# Patient Record
Sex: Female | Born: 1940 | Race: White | Hispanic: No | Smoking: Never smoker
Health system: Southern US, Community
[De-identification: ages and names within clinical notes are randomized; demographics above are authoritative.]

## PROBLEM LIST (undated history)

## (undated) DIAGNOSIS — I10 Essential (primary) hypertension: Secondary | ICD-10-CM

## (undated) DIAGNOSIS — E079 Disorder of thyroid, unspecified: Secondary | ICD-10-CM

## (undated) HISTORY — PX: TONSILLECTOMY: SUR1361

---

## 2005-01-21 ENCOUNTER — Ambulatory Visit: Payer: Self-pay | Admitting: Family Medicine

## 2005-02-16 ENCOUNTER — Ambulatory Visit: Payer: Self-pay | Admitting: Internal Medicine

## 2006-04-07 ENCOUNTER — Ambulatory Visit: Payer: Self-pay | Admitting: Family Medicine

## 2007-05-01 ENCOUNTER — Ambulatory Visit: Payer: Self-pay | Admitting: Family Medicine

## 2008-05-02 ENCOUNTER — Ambulatory Visit: Payer: Self-pay | Admitting: Family Medicine

## 2009-05-24 ENCOUNTER — Ambulatory Visit: Payer: Self-pay | Admitting: Family Medicine

## 2009-06-01 ENCOUNTER — Ambulatory Visit: Payer: Self-pay | Admitting: Family Medicine

## 2010-05-28 ENCOUNTER — Ambulatory Visit: Payer: Self-pay | Admitting: Family Medicine

## 2011-08-23 ENCOUNTER — Ambulatory Visit: Payer: Self-pay | Admitting: Family Medicine

## 2012-08-26 ENCOUNTER — Ambulatory Visit: Payer: Self-pay | Admitting: Family Medicine

## 2013-08-27 ENCOUNTER — Ambulatory Visit: Payer: Self-pay | Admitting: Family Medicine

## 2014-08-30 ENCOUNTER — Ambulatory Visit: Payer: Self-pay | Admitting: Family Medicine

## 2014-09-28 DIAGNOSIS — I10 Essential (primary) hypertension: Secondary | ICD-10-CM | POA: Insufficient documentation

## 2015-09-20 ENCOUNTER — Other Ambulatory Visit: Payer: Self-pay | Admitting: Family Medicine

## 2015-09-20 DIAGNOSIS — Z1231 Encounter for screening mammogram for malignant neoplasm of breast: Secondary | ICD-10-CM

## 2015-10-05 ENCOUNTER — Ambulatory Visit
Admission: RE | Admit: 2015-10-05 | Discharge: 2015-10-05 | Disposition: A | Discharge status: Home / Self Care | Payer: Medicare Other | Source: Ambulatory Visit | Attending: Family Medicine | Admitting: Family Medicine

## 2015-10-05 ENCOUNTER — Other Ambulatory Visit: Payer: Self-pay | Admitting: Family Medicine

## 2015-10-05 DIAGNOSIS — Z1231 Encounter for screening mammogram for malignant neoplasm of breast: Secondary | ICD-10-CM | POA: Diagnosis not present

## 2016-08-27 ENCOUNTER — Other Ambulatory Visit: Payer: Self-pay | Admitting: Family Medicine

## 2016-08-27 DIAGNOSIS — Z1231 Encounter for screening mammogram for malignant neoplasm of breast: Secondary | ICD-10-CM

## 2016-10-07 ENCOUNTER — Ambulatory Visit
Admission: RE | Admit: 2016-10-07 | Discharge: 2016-10-07 | Disposition: A | Discharge status: Home / Self Care | Payer: Medicare Other | Source: Ambulatory Visit | Attending: Family Medicine | Admitting: Family Medicine

## 2016-10-07 ENCOUNTER — Encounter: Payer: Self-pay | Admitting: Radiology

## 2016-10-07 DIAGNOSIS — Z1231 Encounter for screening mammogram for malignant neoplasm of breast: Secondary | ICD-10-CM | POA: Diagnosis present

## 2017-09-01 ENCOUNTER — Other Ambulatory Visit: Payer: Self-pay | Admitting: Family Medicine

## 2017-09-01 DIAGNOSIS — Z1231 Encounter for screening mammogram for malignant neoplasm of breast: Secondary | ICD-10-CM

## 2017-09-04 ENCOUNTER — Other Ambulatory Visit: Payer: Self-pay | Admitting: Family Medicine

## 2017-09-04 DIAGNOSIS — Z780 Asymptomatic menopausal state: Secondary | ICD-10-CM

## 2017-10-09 ENCOUNTER — Ambulatory Visit
Admission: RE | Admit: 2017-10-09 | Discharge: 2017-10-09 | Disposition: A | Discharge status: Home / Self Care | Payer: Medicare Other | Source: Ambulatory Visit | Attending: Family Medicine | Admitting: Family Medicine

## 2017-10-09 DIAGNOSIS — Z1231 Encounter for screening mammogram for malignant neoplasm of breast: Secondary | ICD-10-CM | POA: Insufficient documentation

## 2017-12-17 ENCOUNTER — Ambulatory Visit
Admission: RE | Admit: 2017-12-17 | Discharge: 2017-12-17 | Disposition: A | Discharge status: Home / Self Care | Payer: Medicare Other | Source: Ambulatory Visit | Attending: Family Medicine | Admitting: Family Medicine

## 2017-12-17 DIAGNOSIS — Z780 Asymptomatic menopausal state: Secondary | ICD-10-CM | POA: Diagnosis present

## 2017-12-17 DIAGNOSIS — M810 Age-related osteoporosis without current pathological fracture: Secondary | ICD-10-CM | POA: Diagnosis not present

## 2017-12-17 DIAGNOSIS — I10 Essential (primary) hypertension: Secondary | ICD-10-CM | POA: Insufficient documentation

## 2018-09-01 ENCOUNTER — Other Ambulatory Visit: Payer: Self-pay | Admitting: Family Medicine

## 2018-09-01 DIAGNOSIS — Z1231 Encounter for screening mammogram for malignant neoplasm of breast: Secondary | ICD-10-CM

## 2018-10-12 ENCOUNTER — Ambulatory Visit
Admission: RE | Admit: 2018-10-12 | Discharge: 2018-10-12 | Disposition: A | Discharge status: Home / Self Care | Payer: Medicare Other | Source: Ambulatory Visit | Attending: Family Medicine | Admitting: Family Medicine

## 2018-10-12 DIAGNOSIS — Z1231 Encounter for screening mammogram for malignant neoplasm of breast: Secondary | ICD-10-CM | POA: Diagnosis not present

## 2019-09-13 ENCOUNTER — Other Ambulatory Visit: Payer: Self-pay | Admitting: Family Medicine

## 2019-09-13 DIAGNOSIS — Z1231 Encounter for screening mammogram for malignant neoplasm of breast: Secondary | ICD-10-CM

## 2019-10-21 ENCOUNTER — Ambulatory Visit
Admission: RE | Admit: 2019-10-21 | Discharge: 2019-10-21 | Disposition: A | Discharge status: Home / Self Care | Payer: Medicare Other | Source: Ambulatory Visit | Attending: Family Medicine | Admitting: Family Medicine

## 2019-10-21 DIAGNOSIS — Z1231 Encounter for screening mammogram for malignant neoplasm of breast: Secondary | ICD-10-CM | POA: Diagnosis not present

## 2020-09-18 ENCOUNTER — Other Ambulatory Visit: Payer: Self-pay | Admitting: Family Medicine

## 2020-09-18 DIAGNOSIS — Z1231 Encounter for screening mammogram for malignant neoplasm of breast: Secondary | ICD-10-CM

## 2020-10-24 ENCOUNTER — Ambulatory Visit
Admission: RE | Admit: 2020-10-24 | Discharge: 2020-10-24 | Disposition: A | Discharge status: Home / Self Care | Payer: Medicare Other | Attending: Family Medicine | Admitting: Family Medicine

## 2020-10-24 ENCOUNTER — Ambulatory Visit
Admission: RE | Admit: 2020-10-24 | Discharge: 2020-10-24 | Disposition: A | Discharge status: Home / Self Care | Payer: Medicare Other | Source: Ambulatory Visit | Attending: Family Medicine | Admitting: Family Medicine

## 2020-10-24 ENCOUNTER — Other Ambulatory Visit: Payer: Self-pay

## 2020-10-24 ENCOUNTER — Other Ambulatory Visit: Payer: Self-pay | Admitting: Family Medicine

## 2020-10-24 DIAGNOSIS — M79604 Pain in right leg: Secondary | ICD-10-CM

## 2020-10-24 DIAGNOSIS — Z1231 Encounter for screening mammogram for malignant neoplasm of breast: Secondary | ICD-10-CM | POA: Insufficient documentation

## 2021-09-19 ENCOUNTER — Other Ambulatory Visit: Payer: Self-pay | Admitting: Family Medicine

## 2021-09-19 DIAGNOSIS — Z1231 Encounter for screening mammogram for malignant neoplasm of breast: Secondary | ICD-10-CM

## 2021-11-30 ENCOUNTER — Ambulatory Visit
Admission: RE | Admit: 2021-11-30 | Discharge: 2021-11-30 | Disposition: A | Discharge status: Home / Self Care | Payer: Medicare Other | Source: Ambulatory Visit | Attending: Family Medicine | Admitting: Family Medicine

## 2021-11-30 DIAGNOSIS — Z1231 Encounter for screening mammogram for malignant neoplasm of breast: Secondary | ICD-10-CM | POA: Insufficient documentation

## 2022-08-06 ENCOUNTER — Other Ambulatory Visit: Payer: Self-pay

## 2022-08-06 DIAGNOSIS — M810 Age-related osteoporosis without current pathological fracture: Secondary | ICD-10-CM

## 2022-08-06 DIAGNOSIS — Z1231 Encounter for screening mammogram for malignant neoplasm of breast: Secondary | ICD-10-CM

## 2022-08-17 IMAGING — MG MM DIGITAL SCREENING BILAT W/ TOMO AND CAD
6 of 12 series · 6 of 36 positions shown · non-contrast
Comparison: Previous exam(s).

CLINICAL DATA: Screening.

EXAM:
DIGITAL SCREENING BILATERAL MAMMOGRAM WITH TOMOSYNTHESIS AND CAD
TECHNIQUE: Bilateral screening digital craniocaudal and mediolateral oblique
mammograms were obtained. Bilateral screening digital breast
tomosynthesis was performed. The images were evaluated with
computer-aided detection.

[L MLO synth-2D (1 of 2)]
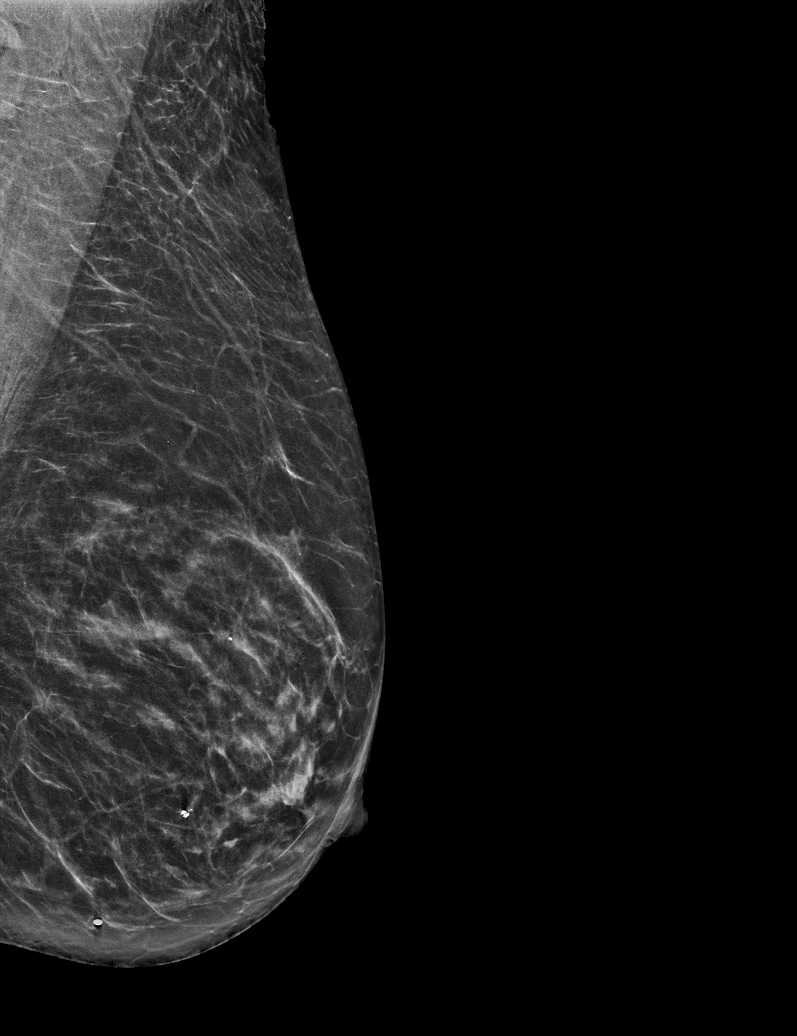

[R MLO synth-2D (1 of 2)]
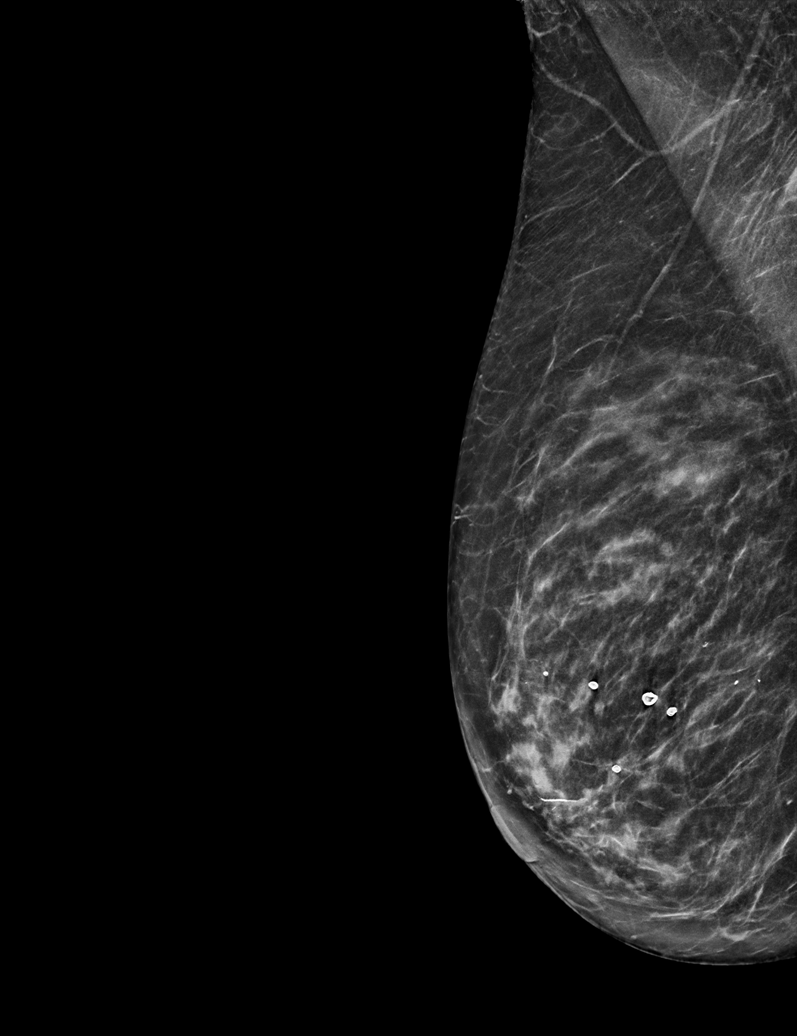

[L CC synth-2D]
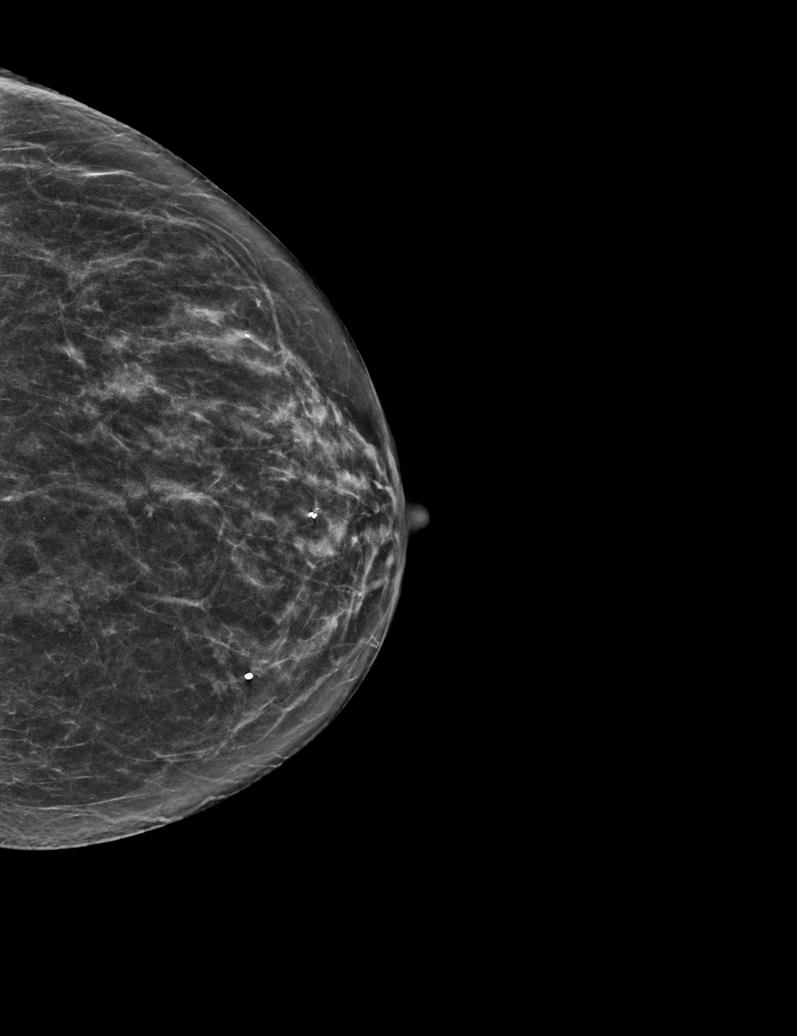

[L MLO synth-2D (2 of 2)]
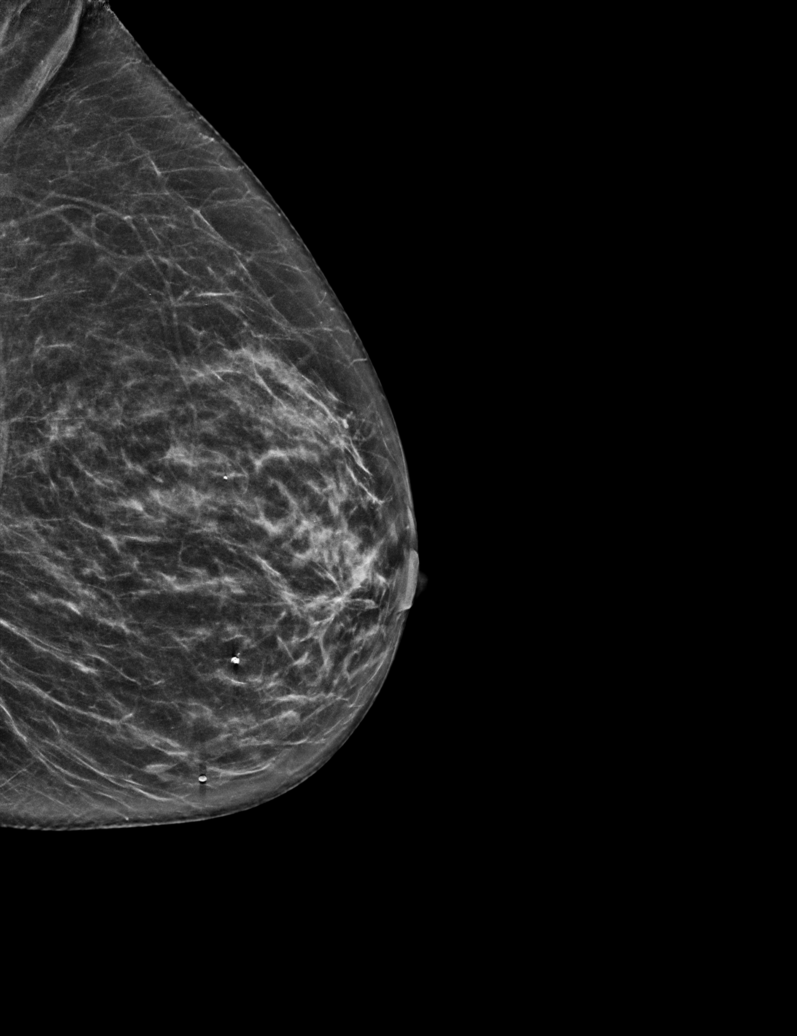

[R CC synth-2D]
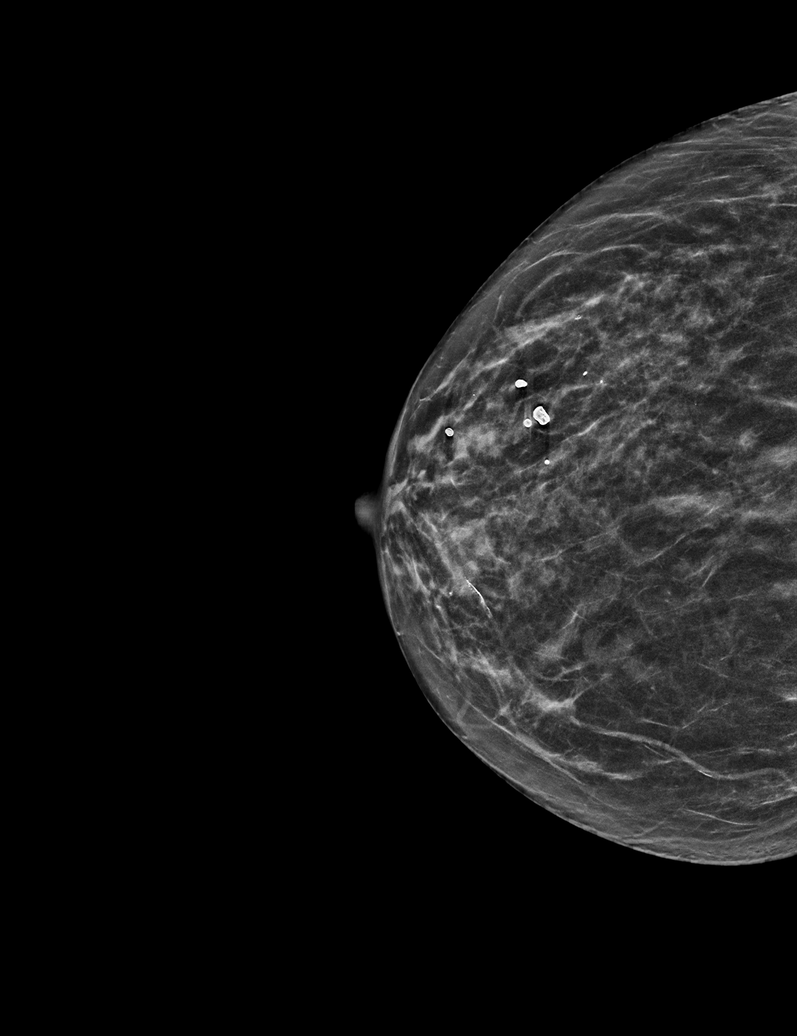

[R MLO synth-2D (2 of 2)]
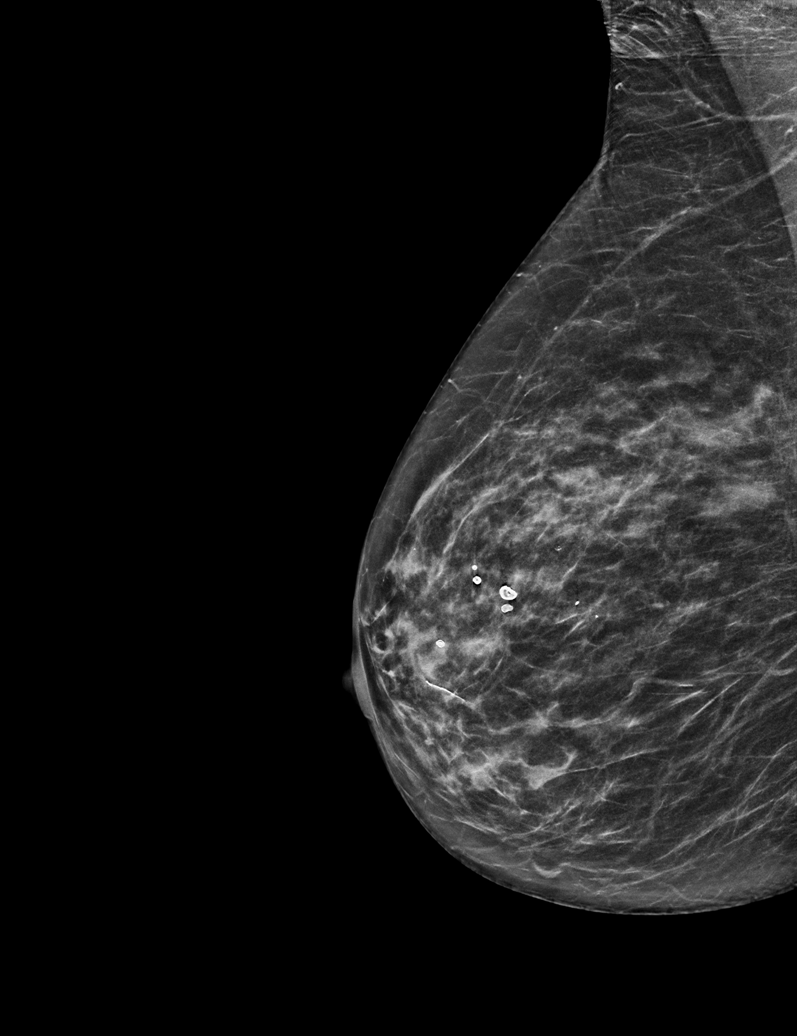

[6 of 36 positions shown; findings below may reference images not displayed]

ACR Breast Density Category c: The breast tissue is heterogeneously
dense, which may obscure small masses.
FINDINGS: There are no findings suspicious for malignancy.
IMPRESSION: No mammographic evidence of malignancy. A result letter of this
screening mammogram will be mailed directly to the patient.

RECOMMENDATION:
Screening mammogram in one year. (Code:Q3-W-BC3)

BI-RADS CATEGORY  1: Negative.

## 2022-11-08 ENCOUNTER — Ambulatory Visit
Admission: RE | Admit: 2022-11-08 | Discharge: 2022-11-08 | Disposition: A | Discharge status: Home / Self Care | Payer: Medicare Other | Source: Ambulatory Visit

## 2022-11-08 VITALS — BP 148/61 | HR 69 | Temp 97.7°F | Resp 18 | Ht 63.0 in | Wt 142.0 lb

## 2022-11-08 DIAGNOSIS — J302 Other seasonal allergic rhinitis: Secondary | ICD-10-CM

## 2022-11-08 DIAGNOSIS — R0982 Postnasal drip: Secondary | ICD-10-CM

## 2022-11-08 DIAGNOSIS — J040 Acute laryngitis: Secondary | ICD-10-CM | POA: Diagnosis not present

## 2022-11-08 HISTORY — DX: Essential (primary) hypertension: I10

## 2022-11-08 HISTORY — DX: Disorder of thyroid, unspecified: E07.9

## 2022-11-08 LAB — POCT RAPID STREP A (OFFICE): Rapid Strep A Screen: NEGATIVE

## 2022-11-08 NOTE — ED Triage Notes (Signed)
Patient to Urgent Care with complaints of hoarseness x1 week. Reports hx of the same w/ allergies but states this feels different. Denies any known fevers. Negative home covid test.   Has been taking cetrizine/ flonase.

## 2022-11-08 NOTE — ED Provider Notes (Signed)
[facility]    CSN: [id number] Arrival date & time: 11/08/22  1106      History   Chief Complaint Chief Complaint  Patient presents with   Hoarse    HPI Tina Wilkerson is a 82 y.o. female.  Patient presents with 1 week history of hoarse voice.  She has postnasal drip, mild sore throat, and mild nonproductive cough.  Treating with Flonase and Zyrtec.  She denies fever, chills, rash, ear pain, shortness of breath, or other symptoms.  Negative COVID test at home.  Patient would like strep test.  Her medical history includes hypertension, CKD, hypothyroidism, osteoporosis.   The history is provided by the patient and medical records.    Past Medical History:  Diagnosis Date   Hypertension    Thyroid disease     There are no problems to display for this patient.   Past Surgical History:  Procedure Laterality Date   TONSILLECTOMY      OB History   No obstetric history on file.      Home Medications    Prior to Admission medications   Medication Sig Start Date End Date Taking? Authorizing Provider  fluticasone (FLONASE) 50 MCG/ACT nasal spray Place into the nose. 06/25/21 07/24/23 Yes [provider]  KLOR-CON M10 10 MEQ tablet Take 1 tablet by mouth daily. 08/29/22  Yes [provider]  amLODipine (NORVASC) 10 MG tablet Take 10 mg by mouth daily.    [provider]  cetirizine (ZYRTEC) 10 MG tablet Take 10 mg by mouth daily.    [provider]  hydrALAZINE (APRESOLINE) 25 MG tablet Take 25 mg by mouth 2 (two) times daily.    [provider]  levothyroxine (SYNTHROID) 150 MCG tablet Take by mouth.    [provider]  simvastatin (ZOCOR) 20 MG tablet Take 20 mg by mouth at bedtime.    [provider]  triamterene-hydrochlorothiazide (DYAZIDE) 37.5-25 MG capsule Take 1 capsule by mouth daily.    [provider]    Family History Family History  Problem Relation Age of Onset   Breast cancer  Neg Hx     Social History Social History   Tobacco Use   Smoking status: Never   Smokeless tobacco: Never  Substance Use Topics   Alcohol use: Never   Drug use: Never     Allergies   Pravastatin, Penicillins, and Sulfa antibiotics   Review of Systems Review of Systems  Constitutional:  Negative for chills and fever.  HENT:  Positive for postnasal drip, sore throat and voice change. Negative for ear pain.   Respiratory:  Positive for cough. Negative for shortness of breath.   Skin:  Negative for rash.  All other systems reviewed and are negative.    Physical Exam Triage Vital Signs ED Triage Vitals [11/08/22 1116]  Enc Vitals Group     BP      Pulse Rate 69     Resp 18     Temp 97.7 F (36.5 C)     Temp src      SpO2 95 %     Weight      Height      Head Circumference      Peak Flow      Pain Score      Pain Loc      Pain Edu?      Excl. in [place]?    No data found.  Updated Vital Signs BP (!) 148/61  Pulse 69   Temp 97.7 F (36.5 C)   Resp 18   Ht 5\' 3"  (1.6 m)   Wt 142 lb (64.4 kg)   SpO2 95%   BMI 25.15 kg/m   Visual Acuity Right Eye Distance:   Left Eye Distance:   Bilateral Distance:    Right Eye Near:   Left Eye Near:    Bilateral Near:     Physical Exam Vitals and nursing note reviewed.  Constitutional:      General: She is not in acute distress.    Appearance: Normal appearance. She is well-developed. She is not ill-appearing.  HENT:     Right Ear: Tympanic membrane normal.     Left Ear: Tympanic membrane normal.     Nose: Nose normal.     Mouth/Throat:     Mouth: Mucous membranes are moist.     Pharynx: Oropharynx is clear.     Comments: Clear PND. Cardiovascular:     Rate and Rhythm: Normal rate and regular rhythm.     Heart sounds: Normal heart sounds.  Pulmonary:     Effort: Pulmonary effort is normal. No respiratory distress.     Breath sounds: Normal breath sounds.  Musculoskeletal:     Cervical back: Neck supple.   Skin:    General: Skin is warm and dry.  Neurological:     Mental Status: She is alert.  Psychiatric:        Mood and Affect: Mood normal.        Behavior: Behavior normal.      UC Treatments / Results  Labs (all labs ordered are listed, but only abnormal results are displayed) Labs Reviewed  POCT RAPID STREP A (OFFICE)    EKG   Radiology No results found.  Procedures Procedures (including critical care time)  Medications Ordered in UC Medications - No data to display  Initial Impression / Assessment and Plan / UC Course  I have reviewed the triage vital signs and the nursing notes.  Pertinent labs & imaging results that were available during my care of the patient were reviewed by me and considered in my medical decision making (see chart for details).    Seasonal allergies, postnasal drip, laryngitis.  Per patient request, rapid strep done and is negative.  She had negative COVID test at home.  Instructed her to continue Flonase and Zyrtec.  Instructed patient to follow up with her PCP if her symptoms are not improving.  She agrees to plan of care.    Final Clinical Impressions(s) / UC Diagnoses   Final diagnoses:  Seasonal allergies  Postnasal drip  Laryngitis     Discharge Instructions      Your strep test is negative.    Continue your allergy medications.  Follow up with your primary care provider if your symptoms are not improving.        ED Prescriptions   None    PDMP not reviewed this encounter.   Sharion Balloon, NP 11/08/22 [phone]

## 2022-11-08 NOTE — Discharge Instructions (Addendum)
Your strep test is negative.    Continue your allergy medications.  Follow up with your primary care provider if your symptoms are not improving.

## 2022-12-11 ENCOUNTER — Ambulatory Visit
Admission: RE | Admit: 2022-12-11 | Discharge: 2022-12-11 | Disposition: A | Discharge status: Home / Self Care | Payer: Medicare Other | Source: Ambulatory Visit | Attending: Family Medicine | Admitting: Family Medicine

## 2022-12-11 DIAGNOSIS — M810 Age-related osteoporosis without current pathological fracture: Secondary | ICD-10-CM | POA: Insufficient documentation

## 2022-12-11 DIAGNOSIS — Z1231 Encounter for screening mammogram for malignant neoplasm of breast: Secondary | ICD-10-CM

## 2022-12-13 ENCOUNTER — Other Ambulatory Visit: Payer: Self-pay | Admitting: Family Medicine

## 2022-12-13 DIAGNOSIS — N63 Unspecified lump in unspecified breast: Secondary | ICD-10-CM

## 2022-12-13 DIAGNOSIS — N6489 Other specified disorders of breast: Secondary | ICD-10-CM

## 2022-12-13 DIAGNOSIS — R928 Other abnormal and inconclusive findings on diagnostic imaging of breast: Secondary | ICD-10-CM

## 2022-12-20 ENCOUNTER — Ambulatory Visit
Admission: RE | Admit: 2022-12-20 | Discharge: 2022-12-20 | Disposition: A | Discharge status: Home / Self Care | Payer: Medicare Other | Source: Ambulatory Visit | Attending: Family Medicine | Admitting: Family Medicine

## 2022-12-20 DIAGNOSIS — N6489 Other specified disorders of breast: Secondary | ICD-10-CM | POA: Insufficient documentation

## 2022-12-20 DIAGNOSIS — N63 Unspecified lump in unspecified breast: Secondary | ICD-10-CM | POA: Diagnosis present

## 2022-12-20 DIAGNOSIS — R928 Other abnormal and inconclusive findings on diagnostic imaging of breast: Secondary | ICD-10-CM | POA: Insufficient documentation

## 2023-09-23 ENCOUNTER — Other Ambulatory Visit: Payer: Self-pay | Admitting: Family Medicine

## 2023-09-23 DIAGNOSIS — Z1231 Encounter for screening mammogram for malignant neoplasm of breast: Secondary | ICD-10-CM

## 2023-12-23 ENCOUNTER — Ambulatory Visit
Admission: RE | Admit: 2023-12-23 | Discharge: 2023-12-23 | Disposition: A | Discharge status: Home / Self Care | Payer: Medicare Other | Source: Ambulatory Visit | Attending: Family Medicine | Admitting: Family Medicine

## 2023-12-23 DIAGNOSIS — Z1231 Encounter for screening mammogram for malignant neoplasm of breast: Secondary | ICD-10-CM | POA: Insufficient documentation

## 2024-05-12 ENCOUNTER — Other Ambulatory Visit: Payer: Self-pay | Admitting: Physical Medicine & Rehabilitation

## 2024-05-12 DIAGNOSIS — G8929 Other chronic pain: Secondary | ICD-10-CM

## 2024-05-12 DIAGNOSIS — M5441 Lumbago with sciatica, right side: Secondary | ICD-10-CM

## 2024-05-21 ENCOUNTER — Ambulatory Visit
Admission: RE | Admit: 2024-05-21 | Discharge: 2024-05-21 | Disposition: A | Discharge status: Home / Self Care | Source: Ambulatory Visit | Attending: Physical Medicine & Rehabilitation | Admitting: Physical Medicine & Rehabilitation

## 2024-05-21 DIAGNOSIS — M5441 Lumbago with sciatica, right side: Secondary | ICD-10-CM

## 2024-05-21 DIAGNOSIS — G8929 Other chronic pain: Secondary | ICD-10-CM

## 2024-06-05 ENCOUNTER — Emergency Department

## 2024-06-05 ENCOUNTER — Encounter: Payer: Self-pay | Admitting: Emergency Medicine

## 2024-06-05 ENCOUNTER — Inpatient Hospital Stay
Admission: EM | Admit: 2024-06-05 | Discharge: 2024-06-08 | DRG: 871 | Disposition: A | Discharge status: Home Health | Attending: Obstetrics and Gynecology | Admitting: Obstetrics and Gynecology

## 2024-06-05 ENCOUNTER — Other Ambulatory Visit: Payer: Self-pay

## 2024-06-05 DIAGNOSIS — Z880 Allergy status to penicillin: Secondary | ICD-10-CM

## 2024-06-05 DIAGNOSIS — I10 Essential (primary) hypertension: Secondary | ICD-10-CM | POA: Diagnosis present

## 2024-06-05 DIAGNOSIS — R0602 Shortness of breath: Secondary | ICD-10-CM | POA: Diagnosis present

## 2024-06-05 DIAGNOSIS — Z79890 Hormone replacement therapy (postmenopausal): Secondary | ICD-10-CM

## 2024-06-05 DIAGNOSIS — I4892 Unspecified atrial flutter: Secondary | ICD-10-CM | POA: Diagnosis present

## 2024-06-05 DIAGNOSIS — R6520 Severe sepsis without septic shock: Secondary | ICD-10-CM | POA: Diagnosis not present

## 2024-06-05 DIAGNOSIS — A419 Sepsis, unspecified organism: Secondary | ICD-10-CM | POA: Diagnosis present

## 2024-06-05 DIAGNOSIS — J188 Other pneumonia, unspecified organism: Secondary | ICD-10-CM | POA: Diagnosis not present

## 2024-06-05 DIAGNOSIS — J9601 Acute respiratory failure with hypoxia: Secondary | ICD-10-CM | POA: Diagnosis present

## 2024-06-05 DIAGNOSIS — E871 Hypo-osmolality and hyponatremia: Secondary | ICD-10-CM | POA: Diagnosis present

## 2024-06-05 DIAGNOSIS — Z882 Allergy status to sulfonamides status: Secondary | ICD-10-CM | POA: Diagnosis not present

## 2024-06-05 DIAGNOSIS — J45901 Unspecified asthma with (acute) exacerbation: Secondary | ICD-10-CM | POA: Diagnosis present

## 2024-06-05 DIAGNOSIS — I2489 Other forms of acute ischemic heart disease: Secondary | ICD-10-CM | POA: Diagnosis present

## 2024-06-05 DIAGNOSIS — J189 Pneumonia, unspecified organism: Secondary | ICD-10-CM | POA: Diagnosis present

## 2024-06-05 DIAGNOSIS — E039 Hypothyroidism, unspecified: Secondary | ICD-10-CM | POA: Diagnosis present

## 2024-06-05 DIAGNOSIS — R7989 Other specified abnormal findings of blood chemistry: Secondary | ICD-10-CM | POA: Insufficient documentation

## 2024-06-05 DIAGNOSIS — Z79899 Other long term (current) drug therapy: Secondary | ICD-10-CM

## 2024-06-05 DIAGNOSIS — T502X5A Adverse effect of carbonic-anhydrase inhibitors, benzothiadiazides and other diuretics, initial encounter: Secondary | ICD-10-CM | POA: Diagnosis present

## 2024-06-05 DIAGNOSIS — I5A Non-ischemic myocardial injury (non-traumatic): Secondary | ICD-10-CM | POA: Diagnosis present

## 2024-06-05 DIAGNOSIS — Z888 Allergy status to other drugs, medicaments and biological substances status: Secondary | ICD-10-CM

## 2024-06-05 LAB — COMPREHENSIVE METABOLIC PANEL WITH GFR
ALT: 68 U/L — ABNORMAL HIGH (ref 0–44)
AST: 84 U/L — ABNORMAL HIGH (ref 15–41)
Albumin: 3.8 g/dL (ref 3.5–5.0)
Alkaline Phosphatase: 84 U/L (ref 38–126)
Anion gap: 12 (ref 5–15)
BUN: 34 mg/dL — ABNORMAL HIGH (ref 8–23)
CO2: 21 mmol/L — ABNORMAL LOW (ref 22–32)
Calcium: 8.8 mg/dL — ABNORMAL LOW (ref 8.9–10.3)
Chloride: 92 mmol/L — ABNORMAL LOW (ref 98–111)
Creatinine, Ser: 1.2 mg/dL — ABNORMAL HIGH (ref 0.44–1.00)
GFR, Estimated: 45 mL/min — ABNORMAL LOW (ref >60)
Glucose, Bld: 153 mg/dL — ABNORMAL HIGH (ref 70–99)
Potassium: 4.5 mmol/L (ref 3.5–5.1)
Sodium: 125 mmol/L — ABNORMAL LOW (ref 135–145)
Total Bilirubin: 0.4 mg/dL (ref 0.0–1.2)
Total Protein: 7.6 g/dL (ref 6.5–8.1)

## 2024-06-05 LAB — CBC WITH DIFFERENTIAL/PLATELET
Abs Immature Granulocytes: 0.08 K/uL — ABNORMAL HIGH (ref 0.00–0.07)
Basophils Absolute: 0 K/uL (ref 0.0–0.1)
Basophils Relative: 0 %
Eosinophils Absolute: 0.1 K/uL (ref 0.0–0.5)
Eosinophils Relative: 1 %
HCT: 32.2 % — ABNORMAL LOW (ref 36.0–46.0)
Hemoglobin: 10.7 g/dL — ABNORMAL LOW (ref 12.0–15.0)
Immature Granulocytes: 1 %
Lymphocytes Relative: 3 %
Lymphs Abs: 0.4 K/uL — ABNORMAL LOW (ref 0.7–4.0)
MCH: 32.4 pg (ref 26.0–34.0)
MCHC: 33.2 g/dL (ref 30.0–36.0)
MCV: 97.6 fL (ref 80.0–100.0)
Monocytes Absolute: 1.4 K/uL — ABNORMAL HIGH (ref 0.1–1.0)
Monocytes Relative: 11 %
Neutro Abs: 10.5 K/uL — ABNORMAL HIGH (ref 1.7–7.7)
Neutrophils Relative %: 84 %
Platelets: 293 K/uL (ref 150–400)
RBC: 3.3 MIL/uL — ABNORMAL LOW (ref 3.87–5.11)
RDW: 12.8 % (ref 11.5–15.5)
WBC: 12.6 K/uL — ABNORMAL HIGH (ref 4.0–10.5)
nRBC: 0 % (ref 0.0–0.2)

## 2024-06-05 LAB — BLOOD GAS, VENOUS
Acid-base deficit: 4.7 mmol/L — ABNORMAL HIGH (ref 0.0–2.0)
Bicarbonate: 22.5 mmol/L (ref 20.0–28.0)
O2 Saturation: 90.8 %
Patient temperature: 37
pCO2, Ven: 49 mmHg (ref 44–60)
pH, Ven: 7.27 (ref 7.25–7.43)
pO2, Ven: 60 mmHg — ABNORMAL HIGH (ref 32–45)

## 2024-06-05 LAB — PROTIME-INR
INR: 1.2 (ref 0.8–1.2)
Prothrombin Time: 15.5 s — ABNORMAL HIGH (ref 11.4–15.2)

## 2024-06-05 LAB — CK: Total CK: 261 U/L — ABNORMAL HIGH (ref 38–234)

## 2024-06-05 LAB — TROPONIN I (HIGH SENSITIVITY)
Troponin I (High Sensitivity): 244 ng/L (ref <18)
Troponin I (High Sensitivity): 190 ng/L (ref <18)

## 2024-06-05 LAB — LACTIC ACID, PLASMA: Lactic Acid, Venous: 1.2 mmol/L (ref 0.5–1.9)

## 2024-06-05 LAB — BRAIN NATRIURETIC PEPTIDE: B Natriuretic Peptide: 767.1 pg/mL — ABNORMAL HIGH (ref 0.0–100.0)

## 2024-06-05 LAB — TSH: TSH: 1.606 u[IU]/mL (ref 0.350–4.500)

## 2024-06-05 MED ORDER — ACETAMINOPHEN 650 MG RE SUPP: 650.0000 mg | Freq: Four times a day (QID) | RECTAL | Status: DC | PRN

## 2024-06-05 MED ORDER — AZITHROMYCIN 500 MG PO TABS: 500.0000 mg | ORAL_TABLET | Freq: Every day | ORAL | Status: DC

## 2024-06-05 MED ORDER — IOHEXOL 350 MG/ML SOLN
50.0000 mL | Freq: Once | INTRAVENOUS | Status: AC | PRN
  Administered 2024-06-05: 50 mL via INTRAVENOUS

## 2024-06-05 MED ORDER — ACETAMINOPHEN 325 MG PO TABS
650.0000 mg | ORAL_TABLET | Freq: Four times a day (QID) | ORAL | Status: DC | PRN
  Administered 2024-06-07: 650 mg via ORAL
  Filled 2024-06-05: qty 2

## 2024-06-05 MED ORDER — BENZONATATE 100 MG PO CAPS: 100.0000 mg | ORAL_CAPSULE | Freq: Three times a day (TID) | ORAL | Status: DC | PRN

## 2024-06-05 MED ORDER — LEVOTHYROXINE SODIUM 50 MCG PO TABS
150.0000 ug | ORAL_TABLET | Freq: Every day | ORAL | Status: DC
  Administered 2024-06-06 – 2024-06-08 (×3): 150 ug via ORAL
  Filled 2024-06-05 (×3): qty 3

## 2024-06-05 MED ORDER — SODIUM CHLORIDE 0.9 % IV SOLN
500.0000 mg | Freq: Once | INTRAVENOUS | Status: AC
  Administered 2024-06-05: 500 mg via INTRAVENOUS
  Filled 2024-06-05: qty 5

## 2024-06-05 MED ORDER — PREDNISONE 20 MG PO TABS
40.0000 mg | ORAL_TABLET | Freq: Every day | ORAL | Status: DC
  Administered 2024-06-06 – 2024-06-08 (×3): 40 mg via ORAL
  Filled 2024-06-05 (×3): qty 2

## 2024-06-05 MED ORDER — ALBUTEROL SULFATE HFA 108 (90 BASE) MCG/ACT IN AERS: 2.0000 | INHALATION_SPRAY | Freq: Four times a day (QID) | RESPIRATORY_TRACT | 2 refills | Status: DC | PRN

## 2024-06-05 MED ORDER — LORATADINE 10 MG PO TABS
10.0000 mg | ORAL_TABLET | Freq: Every day | ORAL | Status: DC
  Administered 2024-06-05 – 2024-06-08 (×4): 10 mg via ORAL
  Filled 2024-06-05 (×4): qty 1

## 2024-06-05 MED ORDER — SODIUM CHLORIDE 0.9 % IV SOLN
100.0000 mg | Freq: Two times a day (BID) | INTRAVENOUS | Status: DC
  Administered 2024-06-05 – 2024-06-08 (×6): 100 mg via INTRAVENOUS
  Filled 2024-06-05 (×6): qty 100

## 2024-06-05 MED ORDER — FLUTICASONE PROPIONATE 50 MCG/ACT NA SUSP
2.0000 | Freq: Every day | NASAL | Status: DC
  Administered 2024-06-05 – 2024-06-08 (×4): 2 via NASAL
  Filled 2024-06-05: qty 16

## 2024-06-05 MED ORDER — ONDANSETRON HCL 4 MG/2ML IJ SOLN: 4.0000 mg | Freq: Four times a day (QID) | INTRAMUSCULAR | Status: DC | PRN

## 2024-06-05 MED ORDER — ALBUTEROL SULFATE (2.5 MG/3ML) 0.083% IN NEBU: 2.5000 mg | INHALATION_SOLUTION | RESPIRATORY_TRACT | Status: DC | PRN

## 2024-06-05 MED ORDER — SODIUM CHLORIDE 0.9 % IV SOLN
2.0000 g | INTRAVENOUS | Status: DC
  Administered 2024-06-06 – 2024-06-08 (×3): 2 g via INTRAVENOUS
  Filled 2024-06-05 (×3): qty 20

## 2024-06-05 MED ORDER — METHYLPREDNISOLONE SODIUM SUCC 40 MG IJ SOLR
40.0000 mg | Freq: Two times a day (BID) | INTRAMUSCULAR | Status: AC
  Administered 2024-06-05 – 2024-06-06 (×2): 40 mg via INTRAVENOUS
  Filled 2024-06-05 (×2): qty 1

## 2024-06-05 MED ORDER — GUAIFENESIN ER 600 MG PO TB12
600.0000 mg | ORAL_TABLET | Freq: Two times a day (BID) | ORAL | Status: DC
  Administered 2024-06-05 – 2024-06-08 (×6): 600 mg via ORAL
  Filled 2024-06-05 (×6): qty 1

## 2024-06-05 MED ORDER — SODIUM CHLORIDE 0.9 % IV SOLN
2.0000 g | Freq: Once | INTRAVENOUS | Status: AC
  Administered 2024-06-05: 2 g via INTRAVENOUS
  Filled 2024-06-05: qty 20

## 2024-06-05 MED ORDER — HEPARIN (PORCINE) 25000 UT/250ML-% IV SOLN
1250.0000 [IU]/h | INTRAVENOUS | Status: DC
  Administered 2024-06-05: 750 [IU]/h via INTRAVENOUS
  Administered 2024-06-06: 1100 [IU]/h via INTRAVENOUS
  Filled 2024-06-05 (×2): qty 250

## 2024-06-05 MED ORDER — MAGNESIUM SULFATE IN D5W 1-5 GM/100ML-% IV SOLN
1.0000 g | Freq: Once | INTRAVENOUS | Status: AC
  Administered 2024-06-05: 1 g via INTRAVENOUS
  Filled 2024-06-05: qty 100

## 2024-06-05 MED ORDER — FLUTICASONE FUROATE-VILANTEROL 100-25 MCG/ACT IN AEPB
1.0000 | INHALATION_SPRAY | Freq: Every day | RESPIRATORY_TRACT | Status: DC
  Administered 2024-06-05 – 2024-06-08 (×4): 1 via RESPIRATORY_TRACT
  Filled 2024-06-05: qty 28

## 2024-06-05 MED ORDER — HEPARIN BOLUS VIA INFUSION
4000.0000 [IU] | Freq: Once | INTRAVENOUS | Status: AC
  Administered 2024-06-05: 4000 [IU] via INTRAVENOUS
  Filled 2024-06-05: qty 4000

## 2024-06-05 MED ORDER — HYDRALAZINE HCL 20 MG/ML IJ SOLN: 5.0000 mg | Freq: Four times a day (QID) | INTRAMUSCULAR | Status: DC | PRN

## 2024-06-05 MED ORDER — ONDANSETRON HCL 4 MG PO TABS: 4.0000 mg | ORAL_TABLET | Freq: Four times a day (QID) | ORAL | Status: DC | PRN

## 2024-06-05 MED ORDER — IPRATROPIUM-ALBUTEROL 0.5-2.5 (3) MG/3ML IN SOLN
3.0000 mL | Freq: Four times a day (QID) | RESPIRATORY_TRACT | Status: DC
  Administered 2024-06-05: 3 mL via RESPIRATORY_TRACT
  Filled 2024-06-05: qty 3

## 2024-06-05 NOTE — Progress Notes (Signed)
 Elink monitoring for the code sepsis protocol.

## 2024-06-05 NOTE — ED Triage Notes (Signed)
 Pt in via POV, reports being seen at [facility] and sent over from them due to hypoxia.  Patient complains of sore throat, cough, shortness of breath x a few days.  Patient tachypneic,  80% on room air upon arrival, dyspnea at rest.    Patient placed on 4L nasal cannula, transported to room at this time.

## 2024-06-05 NOTE — Progress Notes (Signed)
 PHARMACY - ANTICOAGULATION CONSULT NOTE  Pharmacy Consult for heparin Indication: chest pain/ACS  Allergies  Allergen Reactions   Pravastatin Other (See Comments)    Muscle pain   Penicillins Hives   Sulfa Antibiotics Hives    Patient Measurements: Height: 5' 3 (160 cm) Weight: 66.7 kg (147 lb) IBW/kg (Calculated) : 52.4 HEPARIN DW (KG): 65.9  Vital Signs: Temp: 97.8 F (36.6 C) (10/18 1322) Temp Source: Oral (10/18 1322) BP: 137/55 (10/18 1418) Pulse Rate: 60 (10/18 1418)  Labs: Recent Labs    06/05/24 1329  HGB 10.7*  HCT 32.2*  PLT 293  CREATININE 1.20*  TROPONINIHS 244*   Estimated Creatinine Clearance: 32 mL/min (A) (by C-G formula based on SCr of 1.2 mg/dL (H)).  Medical History: Past Medical History:  Diagnosis Date   Hypertension    Thyroid disease    Assessment: 83 y/o female presenting with shortness of breath. PMH significant for HTN. In ED, found to have elevated troponin levels. Pharmacy has been consulted to initiate and manage heparin infusion. Per chart review, patient is not on anticoagulation prior to admission.  Baseline labs: hgb 10.7, plt 293, INR ordered  Goal of Therapy:  Heparin level 0.3-0.7 units/ml Monitor platelets by anticoagulation protocol: Yes   Plan:  Give 4000 units bolus x 1 Start heparin infusion at 750 units/hr Check anti-Xa level in 8 hours and daily while on heparin Continue to monitor H&H and platelets  Thank you for involving pharmacy in this patient's care.   Damien Napoleon, PharmD Clinical Pharmacist 06/05/2024 2:45 PM

## 2024-06-05 NOTE — ED Notes (Signed)
 MD made aware of critical troponin 244.

## 2024-06-05 NOTE — Progress Notes (Signed)
 Shortness of breath, congestion, sore throat, cough, stuffy/runny nose, and fatigue since Tuesday.

## 2024-06-05 NOTE — Consult Note (Signed)
 CODE SEPSIS - PHARMACY COMMUNICATION  **Broad Spectrum Antibiotics should be administered within 1 hour of Sepsis diagnosis**  Time Code Sepsis Called/Page Received: 1413  Antibiotics Ordered: ceftriaxone, azithromycin  Time of 1st antibiotic administration: 1440  Additional action taken by pharmacy: n/a  If necessary, Name of Provider/Nurse Contacted: n/a    Tina Wilkerson ,PharmD Clinical Pharmacist  06/05/2024  2:21 PM

## 2024-06-05 NOTE — ED Provider Notes (Signed)
-----------------------------------------   5:12 PM on 06/05/2024 -----------------------------------------  I took over care of this patient from Dr. Willo.  CTA shows no evidence of PE.  It is consistent with pneumonia.  The patient has been receiving antibiotics.  On reassessment she appears more comfortable on the BiPAP, so we will continue for now.  I consulted Dr. Laurita from the hospitalist service; based on our discussion he agrees to evaluate the patient for admission.   Jacolyn Pae, MD 06/05/24 1712

## 2024-06-05 NOTE — ED Provider Notes (Signed)
 [facility] Provider Note    Event Date/Time   First MD Initiated Contact with Patient 06/05/24 1319     (approximate)   History   Chief Complaint Shortness of Breath and Hypoxia   HPI  Tina Wilkerson is a 83 y.o. female with past medical history of hypertension who presents to the ED complaining of shortness of breath.  Patient reports that she has had about 3 days of dry cough with increasing difficulty breathing.  She denies any fevers or chills and has not had any pain in her chest.  She has also not noticed any pain or swelling in her legs, denies any cardiac history or history of COPD/asthma.  She was noted to be hypoxic at [facility], subsequently referred to the ED for further evaluation.     Physical Exam   Triage Vital Signs: ED Triage Vitals [06/05/24 1322]  Encounter Vitals Group     BP (!) 140/43     Girls Systolic BP Percentile      Girls Diastolic BP Percentile      Boys Systolic BP Percentile      Boys Diastolic BP Percentile      Pulse Rate 70     Resp (!) 26     Temp 97.8 F (36.6 C)     Temp Source Oral     SpO2 (!) 80 %     Weight      Height      Head Circumference      Peak Flow      Pain Score      Pain Loc      Pain Education      Exclude from Growth Chart     Most recent vital signs: Vitals:   06/05/24 1418 06/05/24 1500  BP: (!) 137/55 (!) 143/63  Pulse: 60 63  Resp: (!) 30 (!) 35  Temp:    SpO2: 100% 100%    Constitutional: Alert and oriented. Eyes: Conjunctivae are normal. Head: Atraumatic. Nose: No congestion/rhinnorhea. Mouth/Throat: Mucous membranes are moist.  Cardiovascular: Normal rate, regular rhythm. Grossly normal heart sounds.  2+ radial pulses bilaterally. Respiratory: Tachypneic with increased work of breathing, diminished air movement with crackles to bilateral bases. Gastrointestinal: Soft and nontender. No distention. Musculoskeletal: No lower extremity tenderness nor edema.   Neurologic:  Normal speech and language. No gross focal neurologic deficits are appreciated.    ED Results / Procedures / Treatments   Labs (all labs ordered are listed, but only abnormal results are displayed) Labs Reviewed  CBC WITH DIFFERENTIAL/PLATELET - Abnormal; Notable for the following components:      Result Value   WBC 12.6 (*)    RBC 3.30 (*)    Hemoglobin 10.7 (*)    HCT 32.2 (*)    Neutro Abs 10.5 (*)    Lymphs Abs 0.4 (*)    Monocytes Absolute 1.4 (*)    Abs Immature Granulocytes 0.08 (*)    All other components within normal limits  COMPREHENSIVE METABOLIC PANEL WITH GFR - Abnormal; Notable for the following components:   Sodium 125 (*)    Chloride 92 (*)    CO2 21 (*)    Glucose, Bld 153 (*)    BUN 34 (*)    Creatinine, Ser 1.20 (*)    Calcium 8.8 (*)    AST 84 (*)    ALT 68 (*)    GFR, Estimated 45 (*)    All other components within normal  limits  BRAIN NATRIURETIC PEPTIDE - Abnormal; Notable for the following components:   B Natriuretic Peptide 767.1 (*)    All other components within normal limits  BLOOD GAS, VENOUS - Abnormal; Notable for the following components:   pO2, Ven 60 (*)    Acid-base deficit 4.7 (*)    All other components within normal limits  PROTIME-INR - Abnormal; Notable for the following components:   Prothrombin Time 15.5 (*)    All other components within normal limits  TROPONIN I (HIGH SENSITIVITY) - Abnormal; Notable for the following components:   Troponin I (High Sensitivity) 244 (*)    All other components within normal limits  CULTURE, BLOOD (ROUTINE X 2)  CULTURE, BLOOD (ROUTINE X 2)  LACTIC ACID, PLASMA  LACTIC ACID, PLASMA  HEPARIN LEVEL (UNFRACTIONATED)  TROPONIN I (HIGH SENSITIVITY)     EKG  ED ECG REPORT I, Carlin Palin, the attending physician, personally viewed and interpreted this ECG.   Date: 06/05/2024  EKG Time: 13:27  Rate: 72  Rhythm: normal sinus rhythm  Axis: Normal  Intervals:first-degree  A-V block   ST&T Change: None  RADIOLOGY Chest x-ray reviewed and interpreted by me with multifocal infiltrates.  PROCEDURES:  Critical Care performed: Yes, see critical care procedure note(s)  .Critical Care  Performed by: Palin Carlin, MD Authorized by: Palin Carlin, MD   Critical care provider statement:    Critical care time (minutes):  30   Critical care time was exclusive of:  Separately billable procedures and treating other patients and teaching time   Critical care was necessary to treat or prevent imminent or life-threatening deterioration of the following conditions:  Respiratory failure   Critical care was time spent personally by me on the following activities:  Development of treatment plan with patient or surrogate, discussions with consultants, evaluation of patient's response to treatment, examination of patient, ordering and review of laboratory studies, ordering and review of radiographic studies, ordering and performing treatments and interventions, pulse oximetry, re-evaluation of patient's condition and review of old charts   I assumed direction of critical care for this patient from another provider in my specialty: no     Care discussed with: admitting provider      MEDICATIONS ORDERED IN ED: Medications  azithromycin (ZITHROMAX) 500 mg in sodium chloride 0.9 % 250 mL IVPB (has no administration in time range)  heparin bolus via infusion 4,000 Units (has no administration in time range)  heparin ADULT infusion 100 units/mL (25000 units/250mL) (has no administration in time range)  cefTRIAXone (ROCEPHIN) 2 g in sodium chloride 0.9 % 100 mL IVPB (0 g Intravenous Stopped 06/05/24 1521)     IMPRESSION / MDM / ASSESSMENT AND PLAN / ED COURSE  I reviewed the triage vital signs and the nursing notes.                              83 y.o. female with past medical history of hypertension who presents to the ED complaining of 3 days of dry cough with  increasing difficulty breathing.  Patient's presentation is most consistent with acute presentation with potential threat to life or bodily function.  Differential diagnosis includes, but is not limited to, ACS, PE, COPD, CHF, pneumonia, anemia, electrolyte abnormality, AKI.  Patient ill-appearing and in mild to moderate respiratory distress, noted to be hypoxic to 80% on room air.  She was placed on 4 L nasal cannula with improvement but still remains  tachypneic, however I do not appreciate any wheezing.  EKG shows no evidence of arrhythmia or ischemia, chest x-ray and labs are pending.  She did have COVID and flu testing that was negative at [facility].  Labs with leukocytosis but no significant anemia, patient noted to be hyponatremic at 125 along with mild AKI and mild transaminitis.  BNP elevated at 767 and troponin elevated at 244, will further assess with CT imaging to rule out pulmonary embolism, but will start on heparin for now.  Chest x-ray consistent with infection versus edema, will treat with IV antibiotics for now but hold off on diuresis.  Patient was placed on BiPAP due to significant ongoing tachypnea.  Patient turned over to 1 provider pending CTA results and admission.      FINAL CLINICAL IMPRESSION(S) / ED DIAGNOSES   Final diagnoses:  Shortness of breath  Acute respiratory failure with hypoxia (HCC)     Rx / DC Orders   ED Discharge Orders     None        Note:  This document was prepared using Dragon voice recognition software and may include unintentional dictation errors.   Willo Dunnings, MD 06/05/24 [phone]

## 2024-06-05 NOTE — H&P (Addendum)
 History and Physical    Tina Wilkerson FMW:[id number] DOB: 03-03-1941 DOA: 06/05/2024  PCP: Jyl Railing, MD (Confirm with patient/family/NH records and if not entered, this has to be entered at [facility] point of entry) Patient coming from: Home  I have personally briefly reviewed patient's old medical records in [facility]  Chief Complaint: Cough, wheezing, SOB  HPI: Tina Wilkerson is a 83 y.o. female with medical history significant of HTN, hypothyroidism, seasonal allergy, presented with worsening of cough wheezing shortness of breath.  Symptoms started on Tuesday, patient started to have a sore throat runny nose with copious postnasal drip, and soon she developed a productive cough with yellowish phlegm along with wheezing.  She attributed her breathing symptoms especially wheezing to  seasonal problem she was prescribed with Flonase and some antihistamine medications which gave some relief however she continued to have copious postnasal drip and wheezing and cough.  Denied any chest pain no fever or chills.  She also said that on Tuesday she had 1 day of diarrhea but that resolved.  No abdominal pain no nauseous vomiting.  She denied any cough or choking after eat or drink and she has no trouble swallowing.  She is very healthy and active lives by herself and never experienced any chest pain or shortness of breath associate with moderate activity.  ED Course: Afebrile, nontachycardic very tachypneic breathing rate 30-35 blood pressure 120/55 O2 saturation 80% room air.  Chest x-ray showed multifocal infiltrates.  CTA negative for PE but multifocal pneumonia.  Blood work showed 7.27/49/60, WBC 12.6 hemoglobin 10.7 sodium 125 potassium 4.5 BUN 34 creatinine 1.2 glucose 153.  Troponin first at 222, EKG showed sinus rhythm no acute ST changes.  Patient was given ceftriaxone azithromycin and started on heparin drip.    Review of Systems: As per HPI otherwise 14 point review of systems  negative.    Past Medical History:  Diagnosis Date   Hypertension    Thyroid disease     Past Surgical History:  Procedure Laterality Date   TONSILLECTOMY       reports that she has never smoked. She has never used smokeless tobacco. She reports that she does not drink alcohol and does not use drugs.  Allergies  Allergen Reactions   Pravastatin Other (See Comments)    Muscle pain   Penicillins Hives   Sulfa Antibiotics Hives    Family History  Problem Relation Age of Onset   Breast cancer Neg Hx      Prior to Admission medications   Medication Sig Start Date End Date Taking? Authorizing Provider  albuterol (VENTOLIN HFA) 108 (90 Base) MCG/ACT inhaler Inhale 2 puffs into the lungs every 6 (six) hours as needed for wheezing or shortness of breath. 06/05/24  Yes Laurita Manor T, MD  amLODipine (NORVASC) 10 MG tablet Take 10 mg by mouth daily.    [provider]  cetirizine (ZYRTEC) 10 MG tablet Take 10 mg by mouth daily.    [provider]  fluticasone (FLONASE) 50 MCG/ACT nasal spray Place into the nose. 06/25/21 07/24/23  [provider]  hydrALAZINE (APRESOLINE) 25 MG tablet Take 25 mg by mouth 2 (two) times daily.    [provider]  KLOR-CON M10 10 MEQ tablet Take 1 tablet by mouth daily. 08/29/22   [provider]  levothyroxine (SYNTHROID) 150 MCG tablet Take by mouth.    [provider]  simvastatin (ZOCOR) 20 MG tablet Take 20 mg by mouth at bedtime.  [provider]  triamterene-hydrochlorothiazide (DYAZIDE) 37.5-25 MG capsule Take 1 capsule by mouth daily.    [provider]    Physical Exam: Vitals:   06/05/24 1530 06/05/24 1600 06/05/24 1631 06/05/24 1657  BP: 137/79 98/65 (!) 121/43   Pulse: 64 65 67 66  Resp:   (!) 30   Temp:      TempSrc:      SpO2:   100%   Weight:      Height:        Constitutional: NAD, calm, comfortable Vitals:   06/05/24 1530 06/05/24 1600 06/05/24 1631  06/05/24 1657  BP: 137/79 98/65 (!) 121/43   Pulse: 64 65 67 66  Resp:   (!) 30   Temp:      TempSrc:      SpO2:   100%   Weight:      Height:       Eyes: PERRL, lids and conjunctivae normal ENMT: Mucous membranes are moist. Posterior pharynx clear of any exudate or lesions.Normal dentition.  Neck: normal, supple, no masses, no thyromegaly Respiratory: Diminished breathing sound bilaterally, diffused wheezing bilaterally, scattered crackles bilaterally, increasing breathing effort. No accessory muscle use.  Cardiovascular: Regular rate and rhythm, no murmurs / rubs / gallops. No extremity edema. 2+ pedal pulses. No carotid bruits.  Abdomen: no tenderness, no masses palpated. No hepatosplenomegaly. Bowel sounds positive.  Musculoskeletal: no clubbing / cyanosis. No joint deformity upper and lower extremities. Good ROM, no contractures. Normal muscle tone.  Skin: no rashes, lesions, ulcers. No induration Neurologic: CN 2-12 grossly intact. Sensation intact, DTR normal. Strength 5/5 in all 4.  Psychiatric: Normal judgment and insight. Alert and oriented x 3. Normal mood.     Labs on Admission: I have personally reviewed following labs and imaging studies  CBC: Recent Labs  Lab 06/05/24 1329  WBC 12.6*  NEUTROABS 10.5*  HGB 10.7*  HCT 32.2*  MCV 97.6  PLT 293   Basic Metabolic Panel: Recent Labs  Lab 06/05/24 1329  NA 125*  K 4.5  CL 92*  CO2 21*  GLUCOSE 153*  BUN 34*  CREATININE 1.20*  CALCIUM 8.8*   GFR: Estimated Creatinine Clearance: 32 mL/min (A) (by C-G formula based on SCr of 1.2 mg/dL (H)). Liver Function Tests: Recent Labs  Lab 06/05/24 1329  AST 84*  ALT 68*  ALKPHOS 84  BILITOT 0.4  PROT 7.6  ALBUMIN 3.8   No results for input(s): LIPASE, AMYLASE in the last 168 hours. No results for input(s): AMMONIA in the last 168 hours. Coagulation Profile: Recent Labs  Lab 06/05/24 1329  INR 1.2   Cardiac Enzymes: No results for input(s):  CKTOTAL, CKMB, CKMBINDEX, TROPONINI in the last 168 hours. BNP (last 3 results) No results for input(s): PROBNP in the last 8760 hours. HbA1C: No results for input(s): HGBA1C in the last 72 hours. CBG: No results for input(s): GLUCAP in the last 168 hours. Lipid Profile: No results for input(s): CHOL, HDL, LDLCALC, TRIG, CHOLHDL, LDLDIRECT in the last 72 hours. Thyroid Function Tests: No results for input(s): TSH, T4TOTAL, FREET4, T3FREE, THYROIDAB in the last 72 hours. Anemia Panel: No results for input(s): VITAMINB12, FOLATE, FERRITIN, TIBC, IRON, RETICCTPCT in the last 72 hours. Urine analysis: No results found for: COLORURINE, APPEARANCEUR, LABSPEC, PHURINE, GLUCOSEU, HGBUR, BILIRUBINUR, KETONESUR, PROTEINUR, UROBILINOGEN, NITRITE, LEUKOCYTESUR  Radiological Exams on Admission: CT Angio Chest PE W/Cm &/Or Wo Cm Result Date: 06/05/2024 EXAM: CTA CHEST 06/05/2024 04:25:03 PM TECHNIQUE: CTA of the chest was  performed with the administration of 50 mL iohexol (OMNIPAQUE) 350 MG/ML intravenous contrast. Multiplanar reformatted images are provided for review. MIP images are provided for review. Automated exposure control, iterative reconstruction, and/or weight based adjustment of the mA/kV was utilized to reduce the radiation dose to as low as reasonably achievable. COMPARISON: Chest x-ray dated 06/05/2024. CLINICAL HISTORY: Pulmonary embolism (PE) suspected, high prob. Concern for PE. Shortness of breath and tachypnea. FINDINGS: PULMONARY ARTERIES: Pulmonary arteries are adequately opacified for evaluation. Negative for pulmonary embolism. Main pulmonary artery is normal in caliber. MEDIASTINUM: Cardiomegaly with dilated right ventricle. No pericardial effusion. There is no acute abnormality of the thoracic aorta. LYMPH NODES: No mediastinal, hilar or axillary lymphadenopathy. LUNGS AND PLEURA: Emphysema. Multiple bilateral  irregular ground glass opacities. Mild bronchial wall thickening and bronchiolectasis. No evidence of pleural effusion or pneumothorax. UPPER ABDOMEN: Limited images of the upper abdomen are unremarkable. SOFT TISSUES AND BONES: No acute bone or soft tissue abnormality. IMPRESSION: 1. Multifocal pneumonia superimposed on emphysema. Recommend follow-up chest CT in 812 weeks after treatment to ensure resolution and exclude underlying malignancy. 2. No pulmonary embolism. 3. Cardiomegaly with right ventricular dilatation. No pericardial effusion. Electronically signed by: Norman Gatlin MD 06/05/2024 04:49 PM EDT RP Workstation: [id number]   DG Chest Portable 1 View Result Date: 06/05/2024 CLINICAL DATA:  Shortness of breath for a few days.  Tachypnea. EXAM: PORTABLE CHEST 1 VIEW COMPARISON:  None Available. FINDINGS: The cardiac silhouette, mediastinal and hilar contours are within normal limits given the AP projection and portable technique. There is tortuosity and calcification of the thoracic aorta. Peribronchial thickening, increased interstitial markings and vague patchy airspace nodularity. Findings could be chronic bronchitic changes, acute bronchitis or interstitial pneumonitis. Difficult to be certain without prior studies. No focal airspace consolidation or pleural effusion. No obvious pulmonary lesions or pneumothorax. 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 The bony structures are unremarkable. IMPRESSION: Peribronchial thickening, increased interstitial markings and vague patchy airspace nodularity. Findings could be chronic bronchitic changes, acute bronchitis or interstitial pneumonitis. No focal airspace consolidation or pleural effusion. Electronically Signed   By: MYRTIS Stammer M.D.   On: 06/05/2024 13:54    EKG: Independently reviewed.  Sinus rhythm, no acute ST-T changes  Assessment/Plan Principal Problem:   CAP  (community acquired pneumonia) Active Problems:   Multifocal pneumonia   Troponin level elevated   Acute respiratory failure with hypoxia (HCC)  (please populate well all problems here in Problem List. (For example, if patient is on BP meds at home and you resume or decide to hold them, it is a problem that needs to be her. Same for CAD, COPD, HLD and so on)  Acute hypoxic respiratory failure Multifocal pneumonia, likely aspiration from postnasal drip Suspected acute asthma exacerbation/bronchospasm - Continue BiPAP support - Continue ceftriaxone and azithromycin - Breathing treatment - Sputum culture - Incentive spirometry and flutter valve  Acute asthma exacerbation/bronchospasm - Appears to be a seasonal problem, which was never formally diagnosed.  Recommend follow-up with pulmonary for lung function test. - IV Solu-Medrol - ICS and LABA - DuoNebs plus as needed albuterol  Elevated troponins/myocardial injury - No chest pain, no EKG acute ST changes.  ACS unlikely - Clinically suspect demanding ischemia from likely a prolonged hypoxia process associated with pneumonia and asthma. Continue to trend troponins, and echocardiogram ordered.  If troponin trending negative and echo does not show significant focal wall motion abnormality, likely heparin drip can be discontinued.  And  then patient can be follow-up with cardiology outpatient for outpatient stress test.  Otherwise, consider inpatient cardiology consult.  Hyponatremia - Euvolemic, asymptomatic, clinically suspect chronic.  Probably secondary to HCTZ. - Discontinue hydrochlorothiazide, recheck sodium level tomorrow  Acute transaminitis - No RUQ symptoms, suspect ischemic damage to liver from pneumonia and asthma. - Hold off statin -Change azithromycin to doxycycline - Recheck LFT tomorrow  Hypothyroidism - Continue Synthroid  Total time spent on patient care 75 minutes.  DVT prophylaxis: Heparin drip Code Status:  DNR/DNI Family Communication: Son over the phone Disposition Plan: Patient sick with acute hypoxia secondary to pneumonia and asthma exacerbation requiring IV antibiotics and IV steroid and BiPAP support, expect more than 2 midnight hospital stay Consults called: None Admission status: PCU admit   Cort ONEIDA Mana MD [facility] Pager [phone]  06/05/2024, 5:16 PM

## 2024-06-06 ENCOUNTER — Inpatient Hospital Stay: Admit: 2024-06-06 | Discharge: 2024-06-06 | Disposition: A | Attending: Internal Medicine

## 2024-06-06 DIAGNOSIS — R7989 Other specified abnormal findings of blood chemistry: Secondary | ICD-10-CM

## 2024-06-06 DIAGNOSIS — J9601 Acute respiratory failure with hypoxia: Secondary | ICD-10-CM

## 2024-06-06 DIAGNOSIS — A419 Sepsis, unspecified organism: Secondary | ICD-10-CM | POA: Diagnosis present

## 2024-06-06 DIAGNOSIS — R6520 Severe sepsis without septic shock: Secondary | ICD-10-CM

## 2024-06-06 DIAGNOSIS — J188 Other pneumonia, unspecified organism: Secondary | ICD-10-CM

## 2024-06-06 LAB — ECHOCARDIOGRAM COMPLETE
AR max vel: 2.33 cm2
AV Area VTI: 2.36 cm2
AV Area mean vel: 2.36 cm2
AV Mean grad: 6 mmHg
AV Peak grad: 10.8 mmHg
Ao pk vel: 1.64 m/s
Area-P 1/2: 3.54 cm2
Height: 63 in
MV M vel: 5.3 m/s
MV Peak grad: 112.4 mmHg
Radius: 0.5 cm
S' Lateral: 2.7 cm
Weight: 2405.66 [oz_av]

## 2024-06-06 LAB — COMPREHENSIVE METABOLIC PANEL WITH GFR
ALT: 86 U/L — ABNORMAL HIGH (ref 0–44)
AST: 90 U/L — ABNORMAL HIGH (ref 15–41)
Albumin: 3.4 g/dL — ABNORMAL LOW (ref 3.5–5.0)
Alkaline Phosphatase: 83 U/L (ref 38–126)
Anion gap: 11 (ref 5–15)
BUN: 32 mg/dL — ABNORMAL HIGH (ref 8–23)
CO2: 21 mmol/L — ABNORMAL LOW (ref 22–32)
Calcium: 8.6 mg/dL — ABNORMAL LOW (ref 8.9–10.3)
Chloride: 94 mmol/L — ABNORMAL LOW (ref 98–111)
Creatinine, Ser: 1.08 mg/dL — ABNORMAL HIGH (ref 0.44–1.00)
GFR, Estimated: 51 mL/min — ABNORMAL LOW (ref >60)
Glucose, Bld: 147 mg/dL — ABNORMAL HIGH (ref 70–99)
Potassium: 4.6 mmol/L (ref 3.5–5.1)
Sodium: 126 mmol/L — ABNORMAL LOW (ref 135–145)
Total Bilirubin: 0.5 mg/dL (ref 0.0–1.2)
Total Protein: 6.9 g/dL (ref 6.5–8.1)

## 2024-06-06 LAB — RESP PANEL BY RT-PCR (RSV, FLU A&B, COVID)  RVPGX2
Influenza A by PCR: NEGATIVE
Influenza B by PCR: NEGATIVE
Resp Syncytial Virus by PCR: NEGATIVE
SARS Coronavirus 2 by RT PCR: NEGATIVE

## 2024-06-06 LAB — RESPIRATORY PANEL BY PCR

## 2024-06-06 LAB — CBC
HCT: 29.3 % — ABNORMAL LOW (ref 36.0–46.0)
Hemoglobin: 9.9 g/dL — ABNORMAL LOW (ref 12.0–15.0)
MCH: 32.5 pg (ref 26.0–34.0)
MCHC: 33.8 g/dL (ref 30.0–36.0)
MCV: 96.1 fL (ref 80.0–100.0)
Platelets: 294 K/uL (ref 150–400)
RBC: 3.05 MIL/uL — ABNORMAL LOW (ref 3.87–5.11)
RDW: 12.7 % (ref 11.5–15.5)
WBC: 13.5 K/uL — ABNORMAL HIGH (ref 4.0–10.5)
nRBC: 0.1 % (ref 0.0–0.2)

## 2024-06-06 LAB — HEPARIN LEVEL (UNFRACTIONATED)
Heparin Unfractionated: 0.11 [IU]/mL — ABNORMAL LOW (ref 0.30–0.70)
Heparin Unfractionated: 0.2 [IU]/mL — ABNORMAL LOW (ref 0.30–0.70)
Heparin Unfractionated: 0.27 [IU]/mL — ABNORMAL LOW (ref 0.30–0.70)
Heparin Unfractionated: 0.4 [IU]/mL (ref 0.30–0.70)

## 2024-06-06 LAB — SODIUM, URINE, RANDOM: Sodium, Ur: <30 mmol/L

## 2024-06-06 LAB — OSMOLALITY, URINE: Osmolality, Ur: 469 mosm/kg (ref 300–900)

## 2024-06-06 LAB — OSMOLALITY: Osmolality: 272 mosm/kg — ABNORMAL LOW (ref 275–295)

## 2024-06-06 LAB — TROPONIN I (HIGH SENSITIVITY): Troponin I (High Sensitivity): 128 ng/L (ref <18)

## 2024-06-06 MED ORDER — HEPARIN BOLUS VIA INFUSION
2000.0000 [IU] | Freq: Once | INTRAVENOUS | Status: AC
Start: 2024-06-06 — End: 2024-06-06
  Administered 2024-06-06: 2000 [IU] via INTRAVENOUS
  Filled 2024-06-06: qty 2000

## 2024-06-06 MED ORDER — HEPARIN BOLUS VIA INFUSION
1000.0000 [IU] | Freq: Once | INTRAVENOUS | Status: AC
  Administered 2024-06-06: 1000 [IU] via INTRAVENOUS
  Filled 2024-06-06: qty 1000

## 2024-06-06 MED ORDER — IPRATROPIUM-ALBUTEROL 0.5-2.5 (3) MG/3ML IN SOLN
3.0000 mL | Freq: Three times a day (TID) | RESPIRATORY_TRACT | Status: DC
  Administered 2024-06-06 – 2024-06-08 (×8): 3 mL via RESPIRATORY_TRACT
  Filled 2024-06-06 (×8): qty 3

## 2024-06-06 MED ORDER — HEPARIN BOLUS VIA INFUSION
1000.0000 [IU] | Freq: Once | INTRAVENOUS | Status: AC
Start: 2024-06-06 — End: 2024-06-06
  Administered 2024-06-06: 1000 [IU] via INTRAVENOUS
  Filled 2024-06-06: qty 1000

## 2024-06-06 NOTE — Progress Notes (Signed)
 PHARMACY - ANTICOAGULATION CONSULT NOTE  Pharmacy Consult for heparin Indication: chest pain/ACS  Allergies  Allergen Reactions   Penicillins Hives   Pravastatin Other (See Comments)    Muscle pain   Sulfa Antibiotics Hives and Dermatitis   Alendronate Other (See Comments)    Dental problems and patient declines    Patient Measurements: Height: 5' 3 (160 cm) Weight: 68.2 kg (150 lb 5.7 oz) IBW/kg (Calculated) : 52.4 HEPARIN DW (KG): 65.9  Vital Signs: Temp: 97.6 F (36.4 C) (10/19 0005) Temp Source: Oral (10/18 1803) BP: 156/51 (10/19 0005) Pulse Rate: 73 (10/19 0005)  Labs: Recent Labs    06/05/24 1329 06/05/24 1632 06/05/24 2345  HGB 10.7*  --   --   HCT 32.2*  --   --   PLT 293  --   --   LABPROT 15.5*  --   --   INR 1.2  --   --   HEPARINUNFRC  --   --  0.11*  CREATININE 1.20*  --   --   CKTOTAL  --  261*  --   TROPONINIHS 244* 190*  --    Estimated Creatinine Clearance: 32.3 mL/min (A) (by C-G formula based on SCr of 1.2 mg/dL (H)).  Medical History: Past Medical History:  Diagnosis Date   Hypertension    Thyroid disease    Assessment: 83 y/o female presenting with shortness of breath. PMH significant for HTN. In ED, found to have elevated troponin levels. Pharmacy has been consulted to initiate and manage heparin infusion. Per chart review, patient is not on anticoagulation prior to admission.  Baseline labs: hgb 10.7, plt 293, INR ordered  Goal of Therapy:  Heparin level 0.3-0.7 units/ml Monitor platelets by anticoagulation protocol: Yes  10/18 2345 HL 0.11, subtherapeutic   Plan:  Bolus 2000 units x 1 Increase heparin infusion to 950 units/hr Recheck HL in 8 hrs after rate change CBC daily while on heparin  Thank you for involving pharmacy in this patient's care.   Rankin CANDIE Dills, PharmD, [facility] 06/06/2024 12:42 AM

## 2024-06-06 NOTE — Progress Notes (Signed)
 PHARMACY - ANTICOAGULATION CONSULT NOTE  Pharmacy Consult for heparin Indication: chest pain/ACS  Allergies  Allergen Reactions   Penicillins Hives   Pravastatin Other (See Comments)    Muscle pain   Sulfa Antibiotics Hives and Dermatitis   Alendronate Other (See Comments)    Dental problems and patient declines    Patient Measurements: Height: 5' 3 (160 cm) Weight: 68.2 kg (150 lb 5.7 oz) IBW/kg (Calculated) : 52.4 HEPARIN DW (KG): 65.9  Vital Signs: Temp: 98.2 F (36.8 C) (10/19 0838) Temp Source: Oral (10/19 0838) BP: 133/44 (10/19 0838) Pulse Rate: 69 (10/19 0838)  Labs: Recent Labs    06/05/24 1329 06/05/24 1632 06/05/24 2345 06/06/24 0409 06/06/24 1118  HGB 10.7*  --   --  9.9*  --   HCT 32.2*  --   --  29.3*  --   PLT 293  --   --  294  --   LABPROT 15.5*  --   --   --   --   INR 1.2  --   --   --   --   HEPARINUNFRC  --   --  0.11* 0.40 0.20*  CREATININE 1.20*  --   --  1.08*  --   CKTOTAL  --  261*  --   --   --   TROPONINIHS 244* 190*  --  128*  --    Estimated Creatinine Clearance: 35.9 mL/min (A) (by C-G formula based on SCr of 1.08 mg/dL (H)).  Medical History: Past Medical History:  Diagnosis Date   Hypertension    Thyroid disease    Assessment: 83 y/o female presenting with shortness of breath. PMH significant for HTN. In ED, found to have elevated troponin levels. Pharmacy has been consulted to initiate and manage heparin infusion. Per chart review, patient is not on anticoagulation prior to admission.  Baseline labs: hgb 10.7, plt 293, INR ordered  Goal of Therapy:  Heparin level 0.3-0.7 units/ml Monitor platelets by anticoagulation protocol: Yes  10/18 2345 HL 0.11, subtherapeutic 10/19 0409 HL 0.40 *drawn ~2.5 hours after rate change, therapeutic  10/19 1118 HL 0.20,  Subtherapeutic   Plan: 10/19 1118 HL 0.20,  Subtherapeutic Will order  1000 units heparin bolus  Increase heparin infusion to 1100 units/hr Recheck HL in 8 hrs  after rate change CBC daily while on heparin  Thank you for involving pharmacy in this patient's care.   Allean Haas PharmD Clinical Pharmacist 06/06/2024

## 2024-06-06 NOTE — Plan of Care (Signed)

## 2024-06-06 NOTE — Progress Notes (Signed)
 PHARMACY - ANTICOAGULATION CONSULT NOTE  Pharmacy Consult for heparin Indication: chest pain/ACS  Allergies  Allergen Reactions   Penicillins Hives   Pravastatin Other (See Comments)    Muscle pain   Sulfa Antibiotics Hives and Dermatitis   Alendronate Other (See Comments)    Dental problems and patient declines    Patient Measurements: Height: 5' 3 (160 cm) Weight: 68.2 kg (150 lb 5.7 oz) IBW/kg (Calculated) : 52.4 HEPARIN DW (KG): 65.9  Vital Signs: Temp: 97.2 F (36.2 C) (10/19 2018) Temp Source: Oral (10/19 1542) BP: 152/51 (10/19 2018) Pulse Rate: 79 (10/19 2018)  Labs: Recent Labs    06/05/24 1329 06/05/24 1632 06/05/24 2345 06/06/24 0409 06/06/24 1118 06/06/24 1957  HGB 10.7*  --   --  9.9*  --   --   HCT 32.2*  --   --  29.3*  --   --   PLT 293  --   --  294  --   --   LABPROT 15.5*  --   --   --   --   --   INR 1.2  --   --   --   --   --   HEPARINUNFRC  --   --    < > 0.40 0.20* 0.27*  CREATININE 1.20*  --   --  1.08*  --   --   CKTOTAL  --  261*  --   --   --   --   TROPONINIHS 244* 190*  --  128*  --   --    < > = values in this interval not displayed.   Estimated Creatinine Clearance: 35.9 mL/min (A) (by C-G formula based on SCr of 1.08 mg/dL (H)).  Medical History: Past Medical History:  Diagnosis Date   Hypertension    Thyroid disease    Assessment: 83 y/o female presenting with shortness of breath. PMH significant for HTN. In ED, found to have elevated troponin levels. Pharmacy has been consulted to initiate and manage heparin infusion. Per chart review, patient is not on anticoagulation prior to admission.  Baseline labs: hgb 10.7, plt 293, INR ordered  Goal of Therapy:  Heparin level 0.3-0.7 units/ml Monitor platelets by anticoagulation protocol: Yes  10/18 2345 HL 0.11, subtherapeutic 10/19 0409 HL 0.40 *drawn ~2.5 hours after rate change, therapeutic  10/19 1118 HL 0.20, Subtherapeutic 10/19 1957 HL 0.27, Subtherapeutic     Plan: Heparin bolus 1000 units x1 Increase heparin infusion to 1250 units/hr Check ~8hr heparin level.  Daily CBC, heparin level. Monitor for signs/symptoms of bleeding.  Thank you for involving pharmacy in this patient's care.   Alan Hoe, PharmD 06/06/2024 8:59 PM

## 2024-06-06 NOTE — Progress Notes (Signed)
 PT Cancellation Note  Patient Details Name: Tina Wilkerson MRN: [id number] DOB: 1940/10/14   Cancelled Treatment:    Reason Eval/Treat Not Completed: Fatigue/lethargy limiting ability to participate PT attempted 2x this am, first eating breakfast, then she wanted to sleep when I returned. Requested I try to come back later. Will re-attempt as time allows.   Emersyn Wyss 06/06/2024, 10:13 AM

## 2024-06-06 NOTE — Progress Notes (Signed)
 Echocardiogram 2D Echocardiogram has been performed.  Nataly Pacifico N Kevante Lunt,RDCS 06/06/2024, 9:39 AM

## 2024-06-06 NOTE — Progress Notes (Signed)
 Progress Note   Patient: Tina Wilkerson FMW:[id number] DOB: 1941/06/13 DOA: 06/05/2024     1 DOS: the patient was seen and examined on 06/06/2024   Brief hospital course: Tina Wilkerson is a 83 y.o. female with medical history significant of HTN, hypothyroidism, seasonal allergy, presented with worsening of cough wheezing shortness of breath.  See H&P for full HPI on admission & ED course.  Pt was admitted on 06/05/2024 for further evaluation and management of acute respiratory failure with hypoxia in the setting of multifocal pneumonia, in addition to elevated troponin and hyponatremia as outlined in detail below.    Assessment and Plan:  Acute hypoxic respiratory failure Multifocal pneumonia Initially required BiPAP support 10/19 - on 5 L/min [place] O2 - Supplement O2 & wean as tolerated - Continue IV ceftriaxone and doxycycline - Nebulized bronchodilators - Check viral swabs: Covid/flu/rsv and full viral panel - dc precautions if negative - Sputum culture - Incentive spirometry and flutter valve - Mucolytics   Acute asthma exacerbation - Appears to be a seasonal problem, which was never formally diagnosed.  Recommend follow-up with pulmonary for lung function test. - IV Solu-Medrol >> Prednisone 40 mg - ICS and LABA - Scheduled DuoNebs TID - PRN albuterol nebs   Elevated troponins/myocardial injury Suspect demand ischemia. No chest pain, no EKG acute ST changes.  ACS less likely. Echo: EF 65-70%, grade I DD, mod MR  --Follow pending Echo --On heparin drip for now --Repeat EKG and troponin if chest pain    Hyponatremia Euvolemic, asymptomatic, clinically suspect chronic.  ?Due to HCTZ. TSH normal Na trend: 125 >> 126 - Discontinue hydrochlorothiazide - Monitor BMP - will check serum & urine osm, urine Na   Acute transaminitis - No RUQ symptoms, suspect ischemic damage to liver from pneumonia and asthma. - Hold off statin -Change azithromycin to doxycycline -  Trend LFT's   Hypothyroidism - Continue Synthroid      Subjective: Pt seen with grandson and his wife at bedside this AM. She reports feeling much better than yesterday.  Does not wear O2 at home.  No fever/chills.  Had a lot of congestion and sore throat but felt okay.  No chest pain.   Physical Exam: Vitals:   06/06/24 0838 06/06/24 1227 06/06/24 1350 06/06/24 1542  BP: (!) 133/44 (!) 149/93  (!) 141/54  Pulse: 69 66  72  Resp: (!) 23 (!) 24  19  Temp: 98.2 F (36.8 C) 98.2 F (36.8 C)  98 F (36.7 C)  TempSrc: Oral Oral  Oral  SpO2: 99% 99% 94% 100%  Weight:      Height:       General exam: awake, alert, no acute distress HEENT: atraumatic, clear conjunctiva, anicteric sclera, moist mucus membranes, hearing grossly normal  Respiratory system: faint rhonchi, poor aeration, no wheezes, normal respiratory effort at rest on 5 L/min [place] O2. Cardiovascular system: normal S1/S2, RRR, no pedal edema.   Gastrointestinal system: soft, NT, ND, no HSM felt, +bowel sounds. Central nervous system: A&O x3. no gross focal neurologic deficits, normal speech Extremities: moves all, no edema, normal tone Skin: dry, intact, normal temperature, No rashes, lesions or ulcers Psychiatry: normal mood, congruent affect, judgement and insight appear normal   Data Reviewed:  Notable labs --  Na 125 >> 126 Cl 94 Bicarb 21 Glucose 147 BUN 32 Cr 1.08 Ca 8.6 Albumin 3.4 AST 90 ALT 86 Troponin down-trended 190>>128 Serum osm 272 WBC 13.5 Hbg 9.9 stable  Negative Covid /  Flu / RSV Negative full viral panel  Echo as above Family Communication: at bedside on rounds today  Disposition: Status is: Inpatient Remains inpatient appropriate because: severity of illness as above, remains on O2, IV antibiotics   Planned Discharge Destination: Home    Time spent: 45 minutes  Author: Burnard DELENA Cunning, DO 06/06/2024 5:51 PM  For on call review [email or web address].

## 2024-06-06 NOTE — Progress Notes (Signed)
 PHARMACY - ANTICOAGULATION CONSULT NOTE  Pharmacy Consult for heparin Indication: chest pain/ACS  Allergies  Allergen Reactions   Penicillins Hives   Pravastatin Other (See Comments)    Muscle pain   Sulfa Antibiotics Hives and Dermatitis   Alendronate Other (See Comments)    Dental problems and patient declines    Patient Measurements: Height: 5' 3 (160 cm) Weight: 68.2 kg (150 lb 5.7 oz) IBW/kg (Calculated) : 52.4 HEPARIN DW (KG): 65.9  Vital Signs: Temp: 98.2 F (36.8 C) (10/19 0838) BP: 133/44 (10/19 0838) Pulse Rate: 69 (10/19 0838)  Labs: Recent Labs    06/05/24 1329 06/05/24 1632 06/05/24 2345 06/06/24 0409  HGB 10.7*  --   --  9.9*  HCT 32.2*  --   --  29.3*  PLT 293  --   --  294  LABPROT 15.5*  --   --   --   INR 1.2  --   --   --   HEPARINUNFRC  --   --  0.11* 0.40  CREATININE 1.20*  --   --  1.08*  CKTOTAL  --  261*  --   --   TROPONINIHS 244* 190*  --  128*   Estimated Creatinine Clearance: 35.9 mL/min (A) (by C-G formula based on SCr of 1.08 mg/dL (H)).  Medical History: Past Medical History:  Diagnosis Date   Hypertension    Thyroid disease    Assessment: 83 y/o female presenting with shortness of breath. PMH significant for HTN. In ED, found to have elevated troponin levels. Pharmacy has been consulted to initiate and manage heparin infusion. Per chart review, patient is not on anticoagulation prior to admission.  Baseline labs: hgb 10.7, plt 293, INR ordered  Goal of Therapy:  Heparin level 0.3-0.7 units/ml Monitor platelets by anticoagulation protocol: Yes  10/18 2345 HL 0.11, subtherapeutic 10/19 0409 HL 0.40 *drawn ~2.5 hours after rate change, therapeutic    Plan:  0409 HL drawn too early by lab- will schedule appropriate timed HL continue heparin infusion at 950 units/hr Recheck HL in 8 hrs after rate change CBC daily while on heparin  Thank you for involving pharmacy in this patient's care.   Allean Haas  PharmD Clinical Pharmacist 06/06/2024

## 2024-06-07 DIAGNOSIS — J9601 Acute respiratory failure with hypoxia: Secondary | ICD-10-CM | POA: Diagnosis not present

## 2024-06-07 DIAGNOSIS — J188 Other pneumonia, unspecified organism: Secondary | ICD-10-CM | POA: Diagnosis not present

## 2024-06-07 LAB — CBC
HCT: 29.2 % — ABNORMAL LOW (ref 36.0–46.0)
Hemoglobin: 10 g/dL — ABNORMAL LOW (ref 12.0–15.0)
MCH: 32.3 pg (ref 26.0–34.0)
MCHC: 34.2 g/dL (ref 30.0–36.0)
MCV: 94.2 fL (ref 80.0–100.0)
Platelets: 325 K/uL (ref 150–400)
RBC: 3.1 MIL/uL — ABNORMAL LOW (ref 3.87–5.11)
RDW: 12.9 % (ref 11.5–15.5)
WBC: 17 K/uL — ABNORMAL HIGH (ref 4.0–10.5)
nRBC: 0.1 % (ref 0.0–0.2)

## 2024-06-07 LAB — COMPREHENSIVE METABOLIC PANEL WITH GFR
ALT: 71 U/L — ABNORMAL HIGH (ref 0–44)
AST: 59 U/L — ABNORMAL HIGH (ref 15–41)
Albumin: 3 g/dL — ABNORMAL LOW (ref 3.5–5.0)
Alkaline Phosphatase: 77 U/L (ref 38–126)
Anion gap: 14 (ref 5–15)
BUN: 30 mg/dL — ABNORMAL HIGH (ref 8–23)
CO2: 19 mmol/L — ABNORMAL LOW (ref 22–32)
Calcium: 8.2 mg/dL — ABNORMAL LOW (ref 8.9–10.3)
Chloride: 96 mmol/L — ABNORMAL LOW (ref 98–111)
Creatinine, Ser: 0.98 mg/dL (ref 0.44–1.00)
GFR, Estimated: 57 mL/min — ABNORMAL LOW (ref >60)
Glucose, Bld: 153 mg/dL — ABNORMAL HIGH (ref 70–99)
Potassium: 4.6 mmol/L (ref 3.5–5.1)
Sodium: 129 mmol/L — ABNORMAL LOW (ref 135–145)
Total Bilirubin: 0.2 mg/dL (ref 0.0–1.2)
Total Protein: 6.4 g/dL — ABNORMAL LOW (ref 6.5–8.1)

## 2024-06-07 LAB — EXPECTORATED SPUTUM ASSESSMENT W GRAM STAIN, RFLX TO RESP C

## 2024-06-07 LAB — HEPARIN LEVEL (UNFRACTIONATED): Heparin Unfractionated: >1.1 [IU]/mL — ABNORMAL HIGH (ref 0.30–0.70)

## 2024-06-07 LAB — MYCOPLASMA PNEUMONIAE ANTIBODY, IGM: Mycoplasma pneumo IgM: <770 U/mL (ref 0–769)

## 2024-06-07 MED ORDER — HEPARIN (PORCINE) 25000 UT/250ML-% IV SOLN
1100.0000 [IU]/h | INTRAVENOUS | Status: AC
  Administered 2024-06-07: 1100 [IU]/h via INTRAVENOUS
  Filled 2024-06-07: qty 250

## 2024-06-07 NOTE — Evaluation (Signed)
 Physical Therapy Evaluation Patient Details Name: Tina Wilkerson MRN: [id number] DOB: [birth year] Today's Date: 06/07/2024  History of Present Illness  Tina Wilkerson is a 83 y.o. female with medical history significant of HTN, hypothyroidism, seasonal allergy, presented with worsening of cough wheezing shortness of breath.  Clinical Impression  Pt is pleasant 83 y.o. female admitted for community acquired pneumonia. Pt is A&Ox4 and received in bed. Prior to hospitaliation, pt was independent with use of rollator and ADLs. Pt now requires CGA for amb and STS from lowest bed height. Pt on 5L of O2 upon entry into room. Attempt to wean pt to RA was made - she was able to maintain SpO2 above 90% while at rest. Pt able to amb 160 ft with CGA  using her personal rollator, however desatted while amb to 84% at the lowest and was unable to recover despite cuing for PLB. Pt required inc of O2 to 2L to resat to >88% with exertion. SPT provided review of use of incentive spirometer and provided visual cuing for technique. Pt demonstrates deficits in strength/activity tolerance. Would benefit from skilled PT to address above deficits and promote optimal return to PLOF.       If plan is discharge home, recommend the following: A little help with walking and/or transfers;A little help with bathing/dressing/bathroom   Can travel by private vehicle        Equipment Recommendations None recommended by PT  Recommendations for Other Services       Functional Status Assessment Patient has had a recent decline in their functional status and demonstrates the ability to make significant improvements in function in a reasonable and predictable amount of time.     Precautions / Restrictions Precautions Precautions: Fall Recall of Precautions/Restrictions: Intact      Mobility  Bed Mobility Overal bed mobility: Modified Independent             General bed mobility comments: increased time/effort for  supine>sit    Transfers Overall transfer level: Needs assistance Equipment used: Rollator (4 wheels) Transfers: Sit to/from Stand, Bed to chair/wheelchair/BSC Sit to Stand: Contact guard assist   Step pivot transfers: Contact guard assist       General transfer comment: Verbal cues for hand placement and sequencing.    Ambulation/Gait Ambulation/Gait assistance: Contact guard assist Gait Distance (Feet): 160 Feet Assistive device: Rollator (4 wheels) Gait Pattern/deviations: Decreased stride length Gait velocity: dec     General Gait Details: Use of pt's personal rollator in room. No cues needed. Pt limited by desat-ing. Pt required inc of O2 to 2L to maintain SpO2 at >88% while amb.  Stairs            Wheelchair Mobility     Tilt Bed    Modified Rankin (Stroke Patients Only)       Balance Overall balance assessment: Needs assistance Sitting-balance support: Feet supported, No upper extremity supported Sitting balance-Leahy Scale: Normal     Standing balance support: Bilateral upper extremity supported, During functional activity, Reliant on assistive device for balance Standing balance-Leahy Scale: Fair Standing balance comment: Reliant on rollator for statiuc standing. No LOB observed.                             Pertinent Vitals/Pain Pain Assessment Pain Assessment: No/denies pain    Home Living Family/patient expects to be discharged to:: Private residence (Independent living facility) Living Arrangements: Alone Available Help at Discharge: Other (  Comment) (reports she doesn't have any one who can help) Type of Home: Independent living facility Home Access: Stairs to enter (curb) Entrance Stairs-Rails: None Entrance Stairs-Number of Steps: 1   Home Layout: One level Home Equipment: Rollator (4 wheels);Cane - single point      Prior Function Prior Level of Function : Independent/Modified Independent             Mobility  Comments: Independent with amb using rollator ADLs Comments: independent with ADLs     Extremity/Trunk Assessment   Upper Extremity Assessment Upper Extremity Assessment: Generalized weakness    Lower Extremity Assessment Lower Extremity Assessment: Generalized weakness    Cervical / Trunk Assessment Cervical / Trunk Assessment: Normal  Communication   Communication Communication: No apparent difficulties    Cognition Arousal: Alert Behavior During Therapy: WFL for tasks assessed/performed   PT - Cognitive impairments: No apparent impairments                       PT - Cognition Comments: Pt is A&Ox4. Pleasant and agreeable to PT Following commands: Intact       Cueing Cueing Techniques: Verbal cues, Visual cues     General Comments General comments (skin integrity, edema, etc.): SpO2 at 5L upon entry in room. 95% on RA down to 84% while amb on RA. Inc to 2L of O2 while amb to recover and maintain SpO2 >88%.    Exercises Other Exercises Other Exercises: Review of correct use of incentive spirometer. Pt required visual cuing for technique.   Assessment/Plan    PT Assessment Patient needs continued PT services  PT Problem List Decreased strength;Decreased activity tolerance;Cardiopulmonary status limiting activity       PT Treatment Interventions Gait training;Functional mobility training;Therapeutic activities;Therapeutic exercise;Neuromuscular re-education;Patient/family education    PT Goals (Current goals can be found in the Care Plan section)  Acute Rehab PT Goals Patient Stated Goal: To go home PT Goal Formulation: With patient Time For Goal Achievement: 06/21/24 Potential to Achieve Goals: Good    Frequency Min 1X/week     Co-evaluation               AM-PAC PT 6 Clicks Mobility  Outcome Measure Help needed turning from your back to your side while in a flat bed without using bedrails?: None Help needed moving from lying on your  back to sitting on the side of a flat bed without using bedrails?: None Help needed moving to and from a bed to a chair (including a wheelchair)?: A Little Help needed standing up from a chair using your arms (e.g., wheelchair or bedside chair)?: A Little Help needed to walk in hospital room?: A Little Help needed climbing 3-5 steps with a railing? : A Little 6 Click Score: 20    End of Session Equipment Utilized During Treatment: Gait belt;Oxygen Activity Tolerance: Patient tolerated treatment well Patient left: in chair;with call bell/phone within reach;with chair alarm set Nurse Communication: Mobility status PT Visit Diagnosis: Unsteadiness on feet (R26.81);Muscle weakness (generalized) (M62.81)    Time: 9054-9082 PT Time Calculation (min) (ACUTE ONLY): 1412 min   Charges:                 Lacosta Hargan, SPT   Terrence Wishon 06/07/2024, 12:20 PM

## 2024-06-07 NOTE — Care Management Important Message (Signed)
 Important Message  Patient Details  Name: MEGHEN AKOPYAN MRN: [id number] Date of Birth: 01-08-1941   Important Message Given:  Yes - Medicare IM     Rojelio SHAUNNA Rattler 06/07/2024, 1:18 PM

## 2024-06-07 NOTE — Consult Note (Signed)
 PHARMACY - ANTICOAGULATION CONSULT NOTE  Pharmacy Consult for heparin Indication: chest pain/ACS  Allergies  Allergen Reactions   Penicillins Hives   Pravastatin Other (See Comments)    Muscle pain   Sulfa Antibiotics Hives and Dermatitis   Alendronate Other (See Comments)    Dental problems and patient declines    Patient Measurements: Height: 5' 3 (160 cm) Weight: 68.2 kg (150 lb 5.7 oz) IBW/kg (Calculated) : 52.4 HEPARIN DW (KG): 65.9  Vital Signs: Temp: 97.3 F (36.3 C) (10/20 0402) BP: 151/51 (10/20 0402) Pulse Rate: 69 (10/20 0402)  Labs: Recent Labs    06/05/24 1329 06/05/24 1632 06/05/24 2345 06/06/24 0409 06/06/24 1118 06/06/24 1957 06/07/24 0531  HGB 10.7*  --   --  9.9*  --   --  10.0*  HCT 32.2*  --   --  29.3*  --   --  29.2*  PLT 293  --   --  294  --   --  325  LABPROT 15.5*  --   --   --   --   --   --   INR 1.2  --   --   --   --   --   --   HEPARINUNFRC  --   --    < > 0.40 0.20* 0.27* >1.10*  CREATININE 1.20*  --   --  1.08*  --   --  0.98  CKTOTAL  --  261*  --   --   --   --   --   TROPONINIHS 244* 190*  --  128*  --   --   --    < > = values in this interval not displayed.    Estimated Creatinine Clearance: 39.6 mL/min (by C-G formula based on SCr of 0.98 mg/dL).   Medical History: Past Medical History:  Diagnosis Date   Hypertension    Thyroid disease      Assessment: 83 y/o female presenting with shortness of breath. PMH significant for HTN. Initial workup revealed multifocal pneumonia on CTA and she required BiPAP support and was started on IV antibiotics. In ED, started a heparin drip. Patient's heparin level is supratherapeutic and pharmacy has been consulted to initiate and manage heparin infusion. Per chart review, patient was not on anticoagulation prior to admission.    Baseline/recent labs: hgb 10, plt 325, INR 1.2   Goal of Therapy:  Heparin level 0.3-0.7 units/ml Monitor platelets by anticoagulation protocol:  Yes  10/18 2345 HL 0.11, subtherapeutic 10/19 0409 HL 0.40 *drawn ~2.5 hours after rate change, therapeutic  10/19 1118 HL 0.20, Subtherapeutic 10/19 1957 HL 0.27, Subtherapeutic 10/20 0531 HL >1.10 Supratherapeutic   Plan:  Hold heparin infusion for 1 hour  Decrease heparin infusion to 1100 units/hr  Check ~8hr heparin level  Daily CBC, heparin level. Monitor for signs/symptoms of bleeding.   Fredia Main, PharmD Candidate  06/07/2024,8:40 AM

## 2024-06-07 NOTE — Progress Notes (Signed)
 Progress Note   Patient: Tina Wilkerson FMW:[id number] DOB: 09-08-1940 DOA: 06/05/2024     2 DOS: the patient was seen and examined on 06/07/2024   Brief hospital course: BRIYANA BADMAN is a 83 y.o. female with medical history significant of HTN, hypothyroidism, seasonal allergy, presented with worsening of cough wheezing shortness of breath.  See H&P for full HPI on admission & ED course.  Pt was admitted on 06/05/2024 for further evaluation and management of acute respiratory failure with hypoxia in the setting of multifocal pneumonia, in addition to elevated troponin and hyponatremia as outlined in detail below.    Assessment and Plan:  Acute hypoxic respiratory failure Multifocal pneumonia Initially required BiPAP support Viral swabs negative 10/19 - on 5 L/min [place] O2 10/20 -weaned down to 1 L/min with SpO2 98% - Supplement O2 & wean as tolerated - Continue IV ceftriaxone and doxycycline - Nebulized bronchodilators - Sputum culture not yet collected - Incentive spirometry and flutter valve - Mucolytics   Acute asthma exacerbation - Appears to be a seasonal problem, which was never formally diagnosed.  Recommend follow-up with pulmonary for lung function test. - IV Solu-Medrol >> Prednisone 40 mg, continue - ICS and LABA - Scheduled DuoNebs TID - PRN albuterol nebs   Elevated troponins/myocardial injury Suspect demand ischemia. No chest pain, no EKG acute ST changes.   Not consistent with ACS Echo: EF 65-70%, grade I DD, mod MR, no regional wall motion abnormalities  --Complete 48 hours heparin this afternoon --Repeat EKG and troponin if chest pain   Hyponatremia Euvolemic, asymptomatic, clinically suspect chronic.  ?Due to HCTZ. TSH normal Na trend: 125 >> 126 >> 129 today - Discontinued hydrochlorothiazide - Monitor BMP   Acute transaminitis -improving, suspect due to sepsis - No RUQ symptoms, suspect ischemic damage to liver from pneumonia and asthma. -  Hold off statin - Trend LFT's   Hypothyroidism - Continue Synthroid      Subjective: Pt seen up in recliner on rounds today.  She reports feeling much better, still congested with a bit of a cough but overall improved.  No other acute complaints.  Has been weaned from 5 down to 1 L of oxygen satting 98% on the monitor   Physical Exam: Vitals:   06/07/24 0746 06/07/24 0941 06/07/24 1119 06/07/24 1219  BP:  119/63  (!) 134/46  Pulse:  74  62  Resp:  (!) 24 (!) 23 (!) 24  Temp:  (!) 97.3 F (36.3 C)  97.8 F (36.6 C)  TempSrc:      SpO2: 95%   98%  Weight:      Height:       General exam: awake, alert, no acute distress HEENT:moist mucus membranes, hearing grossly normal  Respiratory system: Lungs clear bilaterally, no wheezes or rhonchi, normal respiratory effort at rest on 1 L/min [place] O2. Cardiovascular system: normal S1/S2, RRR, no pedal edema.   Gastrointestinal system: soft, NT, ND, no HSM felt, +bowel sounds. Central nervous system: A&O x3. no gross focal neurologic deficits, normal speech Extremities: moves all, no edema, normal tone Skin: dry, intact, normal temperature, No rashes, lesions or ulcers Psychiatry: normal mood, congruent affect, judgement and insight appear normal   Data Reviewed:  Notable labs --  Na 125 >> 126 >> 129 Cl 96 Bicarb 19 Glucose 153 BUN 30 Cr 1.08 >> 0.98  Ca 8.2 Albumin 3.0 AST 90 >> 59 ALT 86 >>  Troponin down-trended 190>>128  WBC 13.5 >> 17.0 (on steroids)  Hbg 10.0 stable  Negative Covid / Flu / RSV Negative full viral panel  Echo as above Family Communication: at bedside on rounds 10/19, none present on rounds today, will attempt to call as time allows  Disposition: Status is: Inpatient Remains inpatient appropriate because: remains on O2, IV antibiotics   Planned Discharge Destination: Home    Time spent: 45 minutes  Author: Burnard DELENA Cunning, DO 06/07/2024 2:04 PM  For on call review [email or web address].

## 2024-06-07 NOTE — Plan of Care (Signed)

## 2024-06-08 ENCOUNTER — Other Ambulatory Visit: Payer: Self-pay

## 2024-06-08 DIAGNOSIS — J189 Pneumonia, unspecified organism: Secondary | ICD-10-CM

## 2024-06-08 DIAGNOSIS — J9601 Acute respiratory failure with hypoxia: Secondary | ICD-10-CM

## 2024-06-08 DIAGNOSIS — I4892 Unspecified atrial flutter: Secondary | ICD-10-CM

## 2024-06-08 DIAGNOSIS — A419 Sepsis, unspecified organism: Secondary | ICD-10-CM

## 2024-06-08 LAB — CBC
HCT: 29.3 % — ABNORMAL LOW (ref 36.0–46.0)
Hemoglobin: 10 g/dL — ABNORMAL LOW (ref 12.0–15.0)
MCH: 32.6 pg (ref 26.0–34.0)
MCHC: 34.1 g/dL (ref 30.0–36.0)
MCV: 95.4 fL (ref 80.0–100.0)
Platelets: 387 K/uL (ref 150–400)
RBC: 3.07 MIL/uL — ABNORMAL LOW (ref 3.87–5.11)
RDW: 13.4 % (ref 11.5–15.5)
WBC: 21.8 K/uL — ABNORMAL HIGH (ref 4.0–10.5)
nRBC: 0.1 % (ref 0.0–0.2)

## 2024-06-08 LAB — MAGNESIUM: Magnesium: 2.2 mg/dL (ref 1.7–2.4)

## 2024-06-08 LAB — COMPREHENSIVE METABOLIC PANEL WITH GFR
ALT: 67 U/L — ABNORMAL HIGH (ref 0–44)
AST: 47 U/L — ABNORMAL HIGH (ref 15–41)
Albumin: 2.9 g/dL — ABNORMAL LOW (ref 3.5–5.0)
Alkaline Phosphatase: 74 U/L (ref 38–126)
Anion gap: 13 (ref 5–15)
BUN: 29 mg/dL — ABNORMAL HIGH (ref 8–23)
CO2: 21 mmol/L — ABNORMAL LOW (ref 22–32)
Calcium: 8.4 mg/dL — ABNORMAL LOW (ref 8.9–10.3)
Chloride: 99 mmol/L (ref 98–111)
Creatinine, Ser: 0.92 mg/dL (ref 0.44–1.00)
GFR, Estimated: >60 mL/min (ref >60)
Glucose, Bld: 121 mg/dL — ABNORMAL HIGH (ref 70–99)
Potassium: 4.9 mmol/L (ref 3.5–5.1)
Sodium: 133 mmol/L — ABNORMAL LOW (ref 135–145)
Total Bilirubin: 0.4 mg/dL (ref 0.0–1.2)
Total Protein: 6.4 g/dL — ABNORMAL LOW (ref 6.5–8.1)

## 2024-06-08 LAB — LEGIONELLA PNEUMOPHILA SEROGP 1 UR AG: L. pneumophila Serogp 1 Ur Ag: NEGATIVE

## 2024-06-08 MED ORDER — APIXABAN 5 MG PO TABS
5.0000 mg | ORAL_TABLET | Freq: Two times a day (BID) | ORAL | 1 refills | Status: DC
  Filled 2024-06-08: qty 60, 30d supply, fill #0
  Filled 2024-07-04 – 2024-07-07 (×3): qty 60, 30d supply, fill #1

## 2024-06-08 MED ORDER — ALBUTEROL SULFATE HFA 108 (90 BASE) MCG/ACT IN AERS
2.0000 | INHALATION_SPRAY | Freq: Four times a day (QID) | RESPIRATORY_TRACT | 2 refills | Status: AC | PRN
  Filled 2024-06-08: qty 6.7, 25d supply, fill #0
  Filled 2024-06-08: qty 6.7, 30d supply, fill #0

## 2024-06-08 MED ORDER — CARVEDILOL 3.125 MG PO TABS
3.1250 mg | ORAL_TABLET | Freq: Two times a day (BID) | ORAL | 1 refills | Status: DC
  Filled 2024-06-08: qty 60, 30d supply, fill #0
  Filled 2024-07-04 – 2024-07-07 (×3): qty 60, 30d supply, fill #1

## 2024-06-08 MED ORDER — DOXYCYCLINE HYCLATE 100 MG PO TABS: 100.0000 mg | ORAL_TABLET | Freq: Two times a day (BID) | ORAL | Status: DC

## 2024-06-08 MED ORDER — DOXYCYCLINE HYCLATE 100 MG PO TABS
100.0000 mg | ORAL_TABLET | Freq: Two times a day (BID) | ORAL | 0 refills | Status: AC
  Filled 2024-06-08: qty 4, 2d supply, fill #0

## 2024-06-08 MED ORDER — PREDNISONE 20 MG PO TABS
40.0000 mg | ORAL_TABLET | Freq: Every day | ORAL | 0 refills | Status: AC
  Filled 2024-06-08: qty 4, 2d supply, fill #0

## 2024-06-08 MED ORDER — CEFPODOXIME PROXETIL 200 MG PO TABS
200.0000 mg | ORAL_TABLET | Freq: Two times a day (BID) | ORAL | 0 refills | Status: AC
  Filled 2024-06-08: qty 4, 2d supply, fill #0

## 2024-06-08 NOTE — TOC Initial Note (Signed)
 Transition of Care [facility]) - Initial/Assessment Note    Patient Details  Name: Tina Wilkerson MRN: [id number] Date of Birth: March 12, 1941  [facility]) CM/SW Contact:    Alfonso Rummer, LCSW Phone Number: 06/08/2024, 12:24 PM  Clinical Narrative:                  KEN DELENA Rummer met with pt in room 235 to advise of purpose of TOC and recommendations for home health pt. Pt is in agreement to discharge home with home health physical therapy. LCSW A. Caryn Gienger set up home health physical therapy with [facility]. LCSW A. Rummer requested Dr. Kandis to submit home health order.        Patient Goals and CMS Choice            Expected Discharge Plan and Services      Physical Therapy [facility] Health with [facility].                                         Prior Living Arrangements/Services                       Activities of Daily Living   ADL Screening (condition at time of admission) Independently performs ADLs?: Yes (appropriate for developmental age) Is the patient deaf or have difficulty hearing?: No Does the patient have difficulty seeing, even when wearing glasses/contacts?: No Does the patient have difficulty concentrating, remembering, or making decisions?: No  Permission Sought/Granted                  Emotional Assessment              Admission diagnosis:  Shortness of breath [R06.02] CAP (community acquired pneumonia) [J18.9] Acute respiratory failure with hypoxia (HCC) [J96.01] Patient Active Problem List   Diagnosis Date Noted   Severe sepsis (HCC) 06/06/2024   Multifocal pneumonia 06/05/2024   Troponin level elevated 06/05/2024   Acute respiratory failure with hypoxia (HCC) 06/05/2024   CAP (community acquired pneumonia) 06/05/2024   PCP:  Jyl Railing, MD Pharmacy:   [facility] [phone], [place] - [address] [place] [address] Phone: [phone] Fax:  [phone]  [facility] [address] [place] [address] Phone: [phone] Fax: [phone]     Social Drivers of Health (SDOH) Social History: SDOH Screenings   Food Insecurity: No Food Insecurity (06/05/2024)  Housing: Low Risk  (06/06/2024)  Transportation Needs: No Transportation Needs (06/05/2024)  Recent Concern: Transportation Needs - Unmet Transportation Needs (05/11/2024)   Received from [facility]  Utilities: Not At Risk (06/05/2024)  Financial Resource Strain: Low Risk  (05/11/2024)   Received from [facility]  Social Connections: Moderately Integrated (06/05/2024)  Tobacco Use: Medium Risk (06/07/2024)   Received from [facility]   SDOH Interventions:     Readmission Risk Interventions     No data to display

## 2024-06-08 NOTE — Progress Notes (Signed)
 Mobility Specialist Progress Note:    06/08/24 1138  Mobility  Activity Ambulated with assistance  Level of Assistance Contact guard assist, steadying assist  Assistive Device Four wheel walker  Distance Ambulated (ft) 120 ft  Range of Motion/Exercises Active;All extremities  Activity Response Tolerated well  Mobility visit 1 Mobility  Mobility Specialist Start Time (ACUTE ONLY) 1116  Mobility Specialist Stop Time (ACUTE ONLY) 1129  Mobility Specialist Time Calculation (min) (ACUTE ONLY) 13 min   Pt received in chair, agreeable to mobility. Requried CGA to stand and ambulate with rollator. Tolerated well, SpO2 88-91% on RA throughout. Returned to room, SOB and fatigued. Nurse notified of session, alarm on and belongings in reach. All needs met.  Sherrilee Ditty Mobility Specialist Please contact via Special educational needs teacher or  Rehab office at [phone]

## 2024-06-08 NOTE — Plan of Care (Signed)

## 2024-06-08 NOTE — Discharge Instructions (Signed)
 [facility] will contact patient to schedule physical therapy Phone: [phone]

## 2024-06-08 NOTE — Discharge Summary (Signed)
 EVE REY FMW:[id number] DOB: 16-Jun-1941 DOA: 06/05/2024  PCP: Jyl Railing, MD  Admit date: 06/05/2024 Discharge date: 06/08/2024  Time spent: 35 minutes  Recommendations for Outpatient Follow-up:  Pcp f/u 1 week, check sodium then Referred to cardiology     Discharge Diagnoses:  Principal Problem:   CAP (community acquired pneumonia) Active Problems:   Multifocal pneumonia   Troponin level elevated   Acute respiratory failure with hypoxia (HCC)   Severe sepsis (HCC)   Atrial flutter (HCC)   Discharge Condition: improved  Diet recommendation: heart healthy  Filed Weights   06/05/24 1324 06/05/24 1817  Weight: 66.7 kg 68.2 kg    History of present illness:  From admission h and p NAYLIN BURKLE is a 83 y.o. female with medical history significant of HTN, hypothyroidism, seasonal allergy, presented with worsening of cough wheezing shortness of breath.   Symptoms started on Tuesday, patient started to have a sore throat runny nose with copious postnasal drip, and soon she developed a productive cough with yellowish phlegm along with wheezing.  She attributed her breathing symptoms especially wheezing to  seasonal problem she was prescribed with Flonase and some antihistamine medications which gave some relief however she continued to have copious postnasal drip and wheezing and cough.  Denied any chest pain no fever or chills.  She also said that on Tuesday she had 1 day of diarrhea but that resolved.  No abdominal pain no nauseous vomiting.  She denied any cough or choking after eat or drink and she has no trouble swallowing.  She is very healthy and active lives by herself and never experienced any chest pain or shortness of breath associate with moderate activity.  Hospital Course:   Patient presents with cough and dyspnea and wheeze. Treated for CAP with multifocal pna seen on CTA. Also treated for possible asthma exacerbation with oral prednisone. Treated with  ceftriaxone and doxycycline, will rx two more days abx and prednisone at discharge. Had acute respiratory failure requiring o2. Symptomatically much improved, off oxygen. Cardiac monitoring revealed possible a-fib and flutter was confirmed on EKG on day of discharge, this is a new diagnosis. TTE unremarkable, TSH wnl. Chads2vasc is 4 and she denies history bleeding. Will start low-dose coreg and apixaban, will adjust home bp meds given this introduction. Patient did have troponin elevation on arrival that was thought to be secondary to demand as no chest pain or ischemic changes on EKG and no findings suggestive of ischemia on TTE. Patient did have hyponatremia that resolved, will hold hydrochlorothiazide at discharge. Advise close pcp f/u and have referred patient to cardiology. On day of discharge weaned off oxygen and ambulating well, PT advises home health PT which has been ordered. D/c plan reviewed with patient's son who is in agreement.   Procedures: none   Consultations: none  Discharge Exam: Vitals:   06/08/24 0843 06/08/24 0851  BP:    Pulse:    Resp:    Temp:    SpO2: (S) 96% (S) 91%    General: NAD Cardiovascular: RRR Respiratory: CTAB  Discharge Instructions   Discharge Instructions     Ambulatory referral to Cardiology   Complete by: As directed    Diet - low sodium heart healthy   Complete by: As directed    Increase activity slowly   Complete by: As directed       Allergies as of 06/08/2024       Reactions   Penicillins Hives   Pravastatin Other (See  Comments)   Muscle pain   Sulfa Antibiotics Hives, Dermatitis   Alendronate Other (See Comments)   Dental problems and patient declines        Medication List     PAUSE taking these medications    amLODipine 5 MG tablet Wait to take this until your doctor or other care provider tells you to start again. Commonly known as: NORVASC Take 5 mg by mouth daily.   hydrALAZINE 25 MG tablet Wait to take  this until your doctor or other care provider tells you to start again. Commonly known as: APRESOLINE Take 25 mg by mouth 2 (two) times daily.   hydrochlorothiazide 12.5 MG tablet Wait to take this until your doctor or other care provider tells you to start again. Commonly known as: HYDRODIURIL Take 12.5 mg by mouth daily.       STOP taking these medications    triamterene-hydrochlorothiazide 37.5-25 MG capsule Commonly known as: DYAZIDE       TAKE these medications    albuterol 108 (90 Base) MCG/ACT inhaler Commonly known as: VENTOLIN HFA Inhale 2 puffs into the lungs every 6 (six) hours as needed for wheezing or shortness of breath.   apixaban 5 MG Tabs tablet Commonly known as: ELIQUIS Take 1 tablet (5 mg total) by mouth 2 (two) times daily.   Azelastine HCl 137 MCG/SPRAY Soln Place 1 spray into both nostrils 2 (two) times daily.   carvedilol 3.125 MG tablet Commonly known as: Coreg Take 1 tablet (3.125 mg total) by mouth 2 (two) times daily.   cefpodoxime 200 MG tablet Commonly known as: VANTIN Take 1 tablet (200 mg total) by mouth 2 (two) times daily for 2 days. Starting 10/21   cetirizine 10 MG tablet Commonly known as: ZYRTEC Take 10 mg by mouth daily.   Cholecalciferol 50 MCG (2000 UT) Tabs Take 2,000 Units by mouth every morning.   doxycycline 100 MG tablet Commonly known as: VIBRA-TABS Take 1 tablet (100 mg total) by mouth every 12 (twelve) hours for 2 days.   fluticasone 50 MCG/ACT nasal spray Commonly known as: FLONASE Place into the nose.   levothyroxine 150 MCG tablet Commonly known as: SYNTHROID Take 150 mcg by mouth daily before breakfast.   losartan 100 MG tablet Commonly known as: COZAAR Take 100 mg by mouth daily.   predniSONE 20 MG tablet Commonly known as: DELTASONE Take 2 tablets (40 mg total) by mouth daily with breakfast for 2 days. Start taking on: June 09, 2024   simvastatin 20 MG tablet Commonly known as: ZOCOR Take  20 mg by mouth at bedtime.       Allergies  Allergen Reactions   Penicillins Hives   Pravastatin Other (See Comments)    Muscle pain   Sulfa Antibiotics Hives and Dermatitis   Alendronate Other (See Comments)    Dental problems and patient declines    Follow-up Information     Jyl Railing, MD Follow up.   Specialty: Family Medicine Contact information: [address] [place] [address] [phone]         Perla Evalene PARAS, MD Follow up.   Specialty: Cardiology Why: this is one of the cardiologist's i've referred you Contact information: [address] [place] [address] [phone]                  The results of significant diagnostics from this hospitalization (including imaging, microbiology, ancillary and laboratory) are listed below for reference.  Significant Diagnostic Studies: ECHOCARDIOGRAM COMPLETE Result Date: 06/06/2024    ECHOCARDIOGRAM REPORT   Patient Name:   CORNELIUS SCHUITEMA Date of Exam: 06/06/2024 Medical Rec #:  [id number]      Height:       63.0 in Accession #:    [id number]     Weight:       150.4 lb Date of Birth:  [birth year]      BSA:          1.713 m Patient Age:    83 years       BP:           156/83 mmHg Patient Gender: F              HR:           73 bpm. Exam Location:  [facility] Procedure: 2D Echo, 3D Echo, Color Doppler, Cardiac Doppler and Strain Analysis            (Both Spectral and Color Flow Doppler were utilized during            procedure). Indications:     Elevated Troponin  History:         Patient has no prior history of Echocardiogram examinations.                  Risk Factors:Hypertension and Thyroid Disease.  Sonographer:     Logan Shove RDCS Referring Phys:  [id number] CORT ONEIDA MANA Diagnosing Phys: Cara JONETTA Lovelace MD IMPRESSIONS  1. Left ventricular ejection fraction, by estimation, is 65 to 70%. The left ventricle has normal function. The left ventricle has no regional wall motion abnormalities. Left  ventricular diastolic parameters are consistent with Grade I diastolic dysfunction (impaired relaxation). The average left ventricular global longitudinal strain is 26.8 %. The global longitudinal strain is normal.  2. Right ventricular systolic function is normal. The right ventricular size is normal.  3. The mitral valve is myxomatous. Moderate mitral valve regurgitation.  4. The aortic valve is normal in structure. Aortic valve regurgitation is not visualized. Aortic valve sclerosis is present, with no evidence of aortic valve stenosis. FINDINGS  Left Ventricle: Left ventricular ejection fraction, by estimation, is 65 to 70%. The left ventricle has normal function. The left ventricle has no regional wall motion abnormalities. The average left ventricular global longitudinal strain is 26.8 %. Strain was performed and the global longitudinal strain is normal. The left ventricular internal cavity size was normal in size. There is no left ventricular hypertrophy. Left ventricular diastolic parameters are consistent with Grade I diastolic dysfunction (impaired relaxation). Right Ventricle: The right ventricular size is normal. No increase in right ventricular wall thickness. Right ventricular systolic function is normal. Left Atrium: Left atrial size was normal in size. Right Atrium: Right atrial size was normal in size. Pericardium: There is no evidence of pericardial effusion. Mitral Valve: The mitral valve is myxomatous. Moderate mitral valve regurgitation. Tricuspid Valve: The tricuspid valve is normal in structure. Tricuspid valve regurgitation is trivial. Aortic Valve: The aortic valve is normal in structure. Aortic valve regurgitation is not visualized. Aortic valve sclerosis is present, with no evidence of aortic valve stenosis. Aortic valve mean gradient measures 6.0 mmHg. Aortic valve peak gradient measures 10.8 mmHg. Aortic valve area, by VTI measures 2.36 cm. Pulmonic Valve: The pulmonic valve was normal  in structure. Pulmonic valve regurgitation is not visualized. Aorta: The ascending aorta was not well visualized. IAS/Shunts: No atrial level shunt detected by color flow  Doppler. Additional Comments: 3D was performed not requiring image post processing on an independent workstation and was normal.  LEFT VENTRICLE PLAX 2D LVIDd:         4.30 cm   Diastology LVIDs:         2.70 cm   LV e' medial:    7.94 cm/s LV PW:         0.70 cm   LV E/e' medial:  15.2 LV IVS:        0.90 cm   LV e' lateral:   12.30 cm/s LVOT diam:     1.90 cm   LV E/e' lateral: 9.8 LV SV:         79 LV SV Index:   46        2D Longitudinal Strain LVOT Area:     2.84 cm  2D Strain GLS (A4C):   29.1 %                          2D Strain GLS (A3C):   26.2 %                          2D Strain GLS (A2C):   25.0 %                          2D Strain GLS Avg:     26.8 %                           3D Volume EF:                          3D EF:        66 %                          LV EDV:       108 ml                          LV ESV:       37 ml                          LV SV:        71 ml RIGHT VENTRICLE          IVC RV Basal diam:  4.40 cm  IVC diam: 2.70 cm RV Mid diam:    3.20 cm LEFT ATRIUM             Index        RIGHT ATRIUM           Index LA diam:        3.90 cm 2.28 cm/m   RA Area:     18.60 cm LA Vol (A2C):   81.4 ml 47.52 ml/m  RA Volume:   50.70 ml  29.60 ml/m LA Vol (A4C):   50.0 ml 29.19 ml/m LA Biplane Vol: 65.5 ml 38.24 ml/m  AORTIC VALVE AV Area (Vmax):    2.33 cm AV Area (Vmean):   2.36 cm AV Area (VTI):     2.36 cm AV Vmax:           164.00 cm/s AV Vmean:  111.000 cm/s AV VTI:            0.335 m AV Peak Grad:      10.8 mmHg AV Mean Grad:      6.0 mmHg LVOT Vmax:         135.00 cm/s LVOT Vmean:        92.200 cm/s LVOT VTI:          0.279 m LVOT/AV VTI ratio: 0.83  AORTA Ao Root diam: 3.10 cm Ao Asc diam:  3.10 cm MITRAL VALVE                  TRICUSPID VALVE MV Area (PHT): 3.54 cm       TR Peak grad:   33.4 mmHg MV Decel  Time: 214 msec       TR Vmax:        289.00 cm/s MR Peak grad:    112.4 mmHg MR Mean grad:    66.0 mmHg    SHUNTS MR Vmax:         530.00 cm/s  Systemic VTI:  0.28 m MR Vmean:        373.0 cm/s   Systemic Diam: 1.90 cm MR PISA:         1.57 cm MR PISA Eff ROA: 11 mm MR PISA Radius:  0.50 cm MV E velocity: 121.00 cm/s MV A velocity: 129.00 cm/s MV E/A ratio:  0.94 Dwayne D Callwood MD Electronically signed by Cara JONETTA Lovelace MD Signature Date/Time: 06/06/2024/2:31:17 PM    Final    CT Angio Chest PE W/Cm &/Or Wo Cm Result Date: 06/05/2024 EXAM: CTA CHEST 06/05/2024 04:25:03 PM TECHNIQUE: CTA of the chest was performed with the administration of 50 mL iohexol (OMNIPAQUE) 350 MG/ML intravenous contrast. Multiplanar reformatted images are provided for review. MIP images are provided for review. Automated exposure control, iterative reconstruction, and/or weight based adjustment of the mA/kV was utilized to reduce the radiation dose to as low as reasonably achievable. COMPARISON: Chest x-ray dated 06/05/2024. CLINICAL HISTORY: Pulmonary embolism (PE) suspected, high prob. Concern for PE. Shortness of breath and tachypnea. FINDINGS: PULMONARY ARTERIES: Pulmonary arteries are adequately opacified for evaluation. Negative for pulmonary embolism. Main pulmonary artery is normal in caliber. MEDIASTINUM: Cardiomegaly with dilated right ventricle. No pericardial effusion. There is no acute abnormality of the thoracic aorta. LYMPH NODES: No mediastinal, hilar or axillary lymphadenopathy. LUNGS AND PLEURA: Emphysema. Multiple bilateral irregular ground glass opacities. Mild bronchial wall thickening and bronchiolectasis. No evidence of pleural effusion or pneumothorax. UPPER ABDOMEN: Limited images of the upper abdomen are unremarkable. SOFT TISSUES AND BONES: No acute bone or soft tissue abnormality. IMPRESSION: 1. Multifocal pneumonia superimposed on emphysema. Recommend follow-up chest CT in 812 weeks after treatment  to ensure resolution and exclude underlying malignancy. 2. No pulmonary embolism. 3. Cardiomegaly with right ventricular dilatation. No pericardial effusion. Electronically signed by: Norman Gatlin MD 06/05/2024 04:49 PM EDT RP Workstation: [id number]   DG Chest Portable 1 View Result Date: 06/05/2024 CLINICAL DATA:  Shortness of breath for a few days.  Tachypnea. EXAM: PORTABLE CHEST 1 VIEW COMPARISON:  None Available. FINDINGS: The cardiac silhouette, mediastinal and hilar contours are within normal limits given the AP projection and portable technique. There is tortuosity and calcification of the thoracic aorta. Peribronchial thickening, increased interstitial markings and vague patchy airspace nodularity. Findings could be chronic bronchitic changes, acute bronchitis or interstitial pneumonitis. Difficult to be certain without prior studies. No focal airspace consolidation or pleural effusion. No obvious  pulmonary lesions or pneumothorax. 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 The bony structures are unremarkable. IMPRESSION: Peribronchial thickening, increased interstitial markings and vague patchy airspace nodularity. Findings could be chronic bronchitic changes, acute bronchitis or interstitial pneumonitis. No focal airspace consolidation or pleural effusion. Electronically Signed   By: MYRTIS Stammer M.D.   On: 06/05/2024 13:54   MR LUMBAR SPINE WO CONTRAST Result Date: 05/26/2024 CLINICAL DATA:  Low back pain radiating into both legs beginning in May, 2025. No known injury. EXAM: MRI LUMBAR SPINE WITHOUT CONTRAST TECHNIQUE: Multiplanar, multisequence MR imaging of the lumbar spine was performed. No intravenous contrast was administered. COMPARISON:  None Available. FINDINGS: Segmentation:  Standard. Alignment:  Normal. Vertebrae:  No fracture, evidence of discitis, or bone lesion. Conus medullaris and cauda equina: Conus extends to  the T12-L1 level. Conus and cauda equina appear normal. Paraspinal and other soft tissues: Negative. Disc levels: T10-11 and T11-12 are imaged in the sagittal plane only and negative. T12-L1: Negative. L1-2: Negative. L2-3: Shallow disc bulge and ligamentum flavum thickening cause mild central canal narrowing. The foramina are open. L3-4: There is a shallow disc bulge, ligamentum flavum thickening and mild to moderate facet arthropathy. Moderate central canal stenosis is seen. The foramina are open. L4-5: Shallow disc bulge, ligamentum flavum thickening and mild to moderate facet arthropathy. There is mild to moderate central canal narrowing. The foramina are open. L5-S1: Mild facet degenerative disease and a minimal disc bulge. No stenosis. IMPRESSION: 1. 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 2. Mild to moderate central canal narrowing at L4-5 and mild central canal narrowing at L2-3. Electronically Signed   By: Debby Prader M.D.   On: 05/26/2024 10:49    Microbiology: Recent Results (from the past 240 hours)  Respiratory (~20 pathogens) panel by PCR     Status: None   Collection Time: 06/05/24  9:40 AM   Specimen: Nasopharyngeal Swab; Respiratory  Result Value Ref Range Status   Adenovirus NOT DETECTED NOT DETECTED Final   Coronavirus 229E NOT DETECTED NOT DETECTED Final    Comment: (NOTE) The Coronavirus on the Respiratory Panel, DOES NOT test for the novel  Coronavirus (2019 nCoV)    Coronavirus HKU1 NOT DETECTED NOT DETECTED Final   Coronavirus NL63 NOT DETECTED NOT DETECTED Final   Coronavirus OC43 NOT DETECTED NOT DETECTED Final   Metapneumovirus NOT DETECTED NOT DETECTED Final   Rhinovirus / Enterovirus NOT DETECTED NOT DETECTED Final   Influenza A NOT DETECTED NOT DETECTED Final   Influenza B NOT DETECTED NOT DETECTED Final   Parainfluenza Virus 1 NOT DETECTED NOT DETECTED Final   Parainfluenza Virus 2 NOT DETECTED NOT DETECTED  Final   Parainfluenza Virus 3 NOT DETECTED NOT DETECTED Final   Parainfluenza Virus 4 NOT DETECTED NOT DETECTED Final   Respiratory Syncytial Virus NOT DETECTED NOT DETECTED Final   Bordetella pertussis NOT DETECTED NOT DETECTED Final   Bordetella Parapertussis NOT DETECTED NOT DETECTED Final   Chlamydophila pneumoniae NOT DETECTED NOT DETECTED Final   Mycoplasma pneumoniae NOT DETECTED NOT DETECTED Final    Comment: Performed at [facility], [address], [place], [place] [address]  Culture, blood (routine x 2)     Status: None (Preliminary result)   Collection Time: 06/05/24  2:35 PM   Specimen: BLOOD  Result Value Ref Range Status   Specimen Description BLOOD RIGHT ANTECUBITAL  Final   Special Requests  Final    BOTTLES DRAWN AEROBIC AND ANAEROBIC Blood Culture adequate volume   Culture   Final    NO GROWTH 3 DAYS Performed at [facility], [address] [place], [place] [address]    Report Status PENDING  Incomplete  Culture, blood (routine x 2)     Status: None (Preliminary result)   Collection Time: 06/05/24  2:35 PM   Specimen: BLOOD  Result Value Ref Range Status   Specimen Description BLOOD BLOOD LEFT FOREARM  Final   Special Requests   Final    BOTTLES DRAWN AEROBIC AND ANAEROBIC Blood Culture adequate volume   Culture   Final    NO GROWTH 3 DAYS Performed at [facility], [address] [place], [place] [address]    Report Status PENDING  Incomplete  Resp panel by RT-PCR (RSV, Flu A&B, Covid) Anterior Nasal Swab     Status: None   Collection Time: 06/06/24  9:40 AM   Specimen: Anterior Nasal Swab  Result Value Ref Range Status   SARS Coronavirus 2 by RT PCR NEGATIVE NEGATIVE Final    Comment: (NOTE) SARS-CoV-2 target nucleic acids are NOT DETECTED.  The SARS-CoV-2 RNA is generally detectable in upper respiratory specimens during the acute phase of infection. The lowest concentration of SARS-CoV-2 viral copies this assay can detect  is 138 copies/mL. A negative result does not preclude SARS-Cov-2 infection and should not be used as the sole basis for treatment or other patient management decisions. A negative result may occur with  improper specimen collection/handling, submission of specimen other than nasopharyngeal swab, presence of viral mutation(s) within the areas targeted by this assay, and inadequate number of viral copies(<138 copies/mL). A negative result must be combined with clinical observations, patient history, and epidemiological information. The expected result is Negative.  Fact Sheet for Patients:  BloggerCourse.com  Fact Sheet for Healthcare Providers:  SeriousBroker.it  This test is no t yet approved or cleared by the [place]  FDA and  has been authorized for detection and/or diagnosis of SARS-CoV-2 by FDA under an Emergency Use Authorization (EUA). This EUA will remain  in effect (meaning this test can be used) for the duration of the COVID-19 declaration under Section 564(b)(1) of the Act, 21 U.S.C.section 360bbb-3(b)(1), unless the authorization is terminated  or revoked sooner.       Influenza A by PCR NEGATIVE NEGATIVE Final   Influenza B by PCR NEGATIVE NEGATIVE Final    Comment: (NOTE) The Xpert Xpress SARS-CoV-2/FLU/RSV plus assay is intended as an aid in the diagnosis of influenza from Nasopharyngeal swab specimens and should not be used as a sole basis for treatment. Nasal washings and aspirates are unacceptable for Xpert Xpress SARS-CoV-2/FLU/RSV testing.  Fact Sheet for Patients: BloggerCourse.com  Fact Sheet for Healthcare Providers: SeriousBroker.it  This test is not yet approved or cleared by the [place]  FDA and has been authorized for detection and/or diagnosis of SARS-CoV-2 by FDA under an Emergency Use Authorization (EUA). This EUA will remain in effect  (meaning this test can be used) for the duration of the COVID-19 declaration under Section 564(b)(1) of the Act, 21 U.S.C. section 360bbb-3(b)(1), unless the authorization is terminated or revoked.     Resp Syncytial Virus by PCR NEGATIVE NEGATIVE Final    Comment: (NOTE) Fact Sheet for Patients: BloggerCourse.com  Fact Sheet for Healthcare Providers: SeriousBroker.it  This test is not yet approved or cleared by the [place]  FDA and has been authorized for detection and/or diagnosis  of SARS-CoV-2 by FDA under an Emergency Use Authorization (EUA). This EUA will remain in effect (meaning this test can be used) for the duration of the COVID-19 declaration under Section 564(b)(1) of the Act, 21 U.S.C. section 360bbb-3(b)(1), unless the authorization is terminated or revoked.  Performed at [facility], [address] [place], [place] [address]   Expectorated Sputum Assessment w Gram Stain, Rflx to Resp Cult     Status: None   Collection Time: 06/07/24  2:30 PM   Specimen: Sputum  Result Value Ref Range Status   Specimen Description SPUTUM  Final   Special Requests NONE  Final   Sputum evaluation   Final    THIS SPECIMEN IS ACCEPTABLE FOR SPUTUM CULTURE Performed at [facility], [address] [place], [place] [address]    Report Status 06/07/2024 FINAL  Final  Culture, Respiratory w Gram Stain     Status: None (Preliminary result)   Collection Time: 06/07/24  2:30 PM   Specimen: SPU  Result Value Ref Range Status   Specimen Description   Final    SPUTUM Performed at [facility], [address] [place], [place] [address]    Special Requests   Final    NONE Reflexed from [id number] Performed at [facility], [address] [place], [place] [address]    Gram Stain   Final    FEW WBC PRESENT,BOTH PMN AND MONONUCLEAR RARE GRAM POSITIVE COCCI RARE GRAM NEGATIVE RODS RARE GRAM  POSITIVE RODS Performed at [facility], [address] [place], [place] [address]    Culture PENDING  Incomplete   Report Status PENDING  Incomplete     Labs: Basic Metabolic Panel: Recent Labs  Lab 06/05/24 1329 06/06/24 0409 06/07/24 0531 06/08/24 0516  NA 125* 126* 129* 133*  K 4.5 4.6 4.6 4.9  CL 92* 94* 96* 99  CO2 21* 21* 19* 21*  GLUCOSE 153* 147* 153* 121*  BUN 34* 32* 30* 29*  CREATININE 1.20* 1.08* 0.98 0.92  CALCIUM 8.8* 8.6* 8.2* 8.4*  MG  --   --   --  2.2   Liver Function Tests: Recent Labs  Lab 06/05/24 1329 06/06/24 0409 06/07/24 0531 06/08/24 0516  AST 84* 90* 59* 47*  ALT 68* 86* 71* 67*  ALKPHOS 84 83 77 74  BILITOT 0.4 0.5 0.2 0.4  PROT 7.6 6.9 6.4* 6.4*  ALBUMIN 3.8 3.4* 3.0* 2.9*   No results for input(s): LIPASE, AMYLASE in the last 168 hours. No results for input(s): AMMONIA in the last 168 hours. CBC: Recent Labs  Lab 06/05/24 1329 06/06/24 0409 06/07/24 0531 06/08/24 0516  WBC 12.6* 13.5* 17.0* 21.8*  NEUTROABS 10.5*  --   --   --   HGB 10.7* 9.9* 10.0* 10.0*  HCT 32.2* 29.3* 29.2* 29.3*  MCV 97.6 96.1 94.2 95.4  PLT 293 294 325 387   Cardiac Enzymes: Recent Labs  Lab 06/05/24 1632  CKTOTAL 261*   BNP: BNP (last 3 results) Recent Labs    06/05/24 1329  BNP 767.1*    ProBNP (last 3 results) No results for input(s): PROBNP in the last 8760 hours.  CBG: No results for input(s): GLUCAP in the last 168 hours.     Signed:  Devaughn KATHEE Ban MD.  [facility] 06/08/2024, 2:02 PM

## 2024-06-10 LAB — CULTURE, BLOOD (ROUTINE X 2)
Culture: NO GROWTH
Culture: NO GROWTH
Special Requests: ADEQUATE
Special Requests: ADEQUATE

## 2024-06-10 LAB — CULTURE, RESPIRATORY W GRAM STAIN: Culture: NORMAL

## 2024-06-16 ENCOUNTER — Encounter: Payer: Self-pay | Admitting: Cardiology

## 2024-06-16 ENCOUNTER — Ambulatory Visit: Attending: Cardiology | Admitting: Cardiology

## 2024-06-16 VITALS — BP 146/78 | HR 58 | Ht 63.0 in | Wt 136.0 lb

## 2024-06-16 DIAGNOSIS — E039 Hypothyroidism, unspecified: Secondary | ICD-10-CM | POA: Diagnosis present

## 2024-06-16 DIAGNOSIS — J45909 Unspecified asthma, uncomplicated: Secondary | ICD-10-CM | POA: Diagnosis not present

## 2024-06-16 DIAGNOSIS — R7989 Other specified abnormal findings of blood chemistry: Secondary | ICD-10-CM | POA: Insufficient documentation

## 2024-06-16 DIAGNOSIS — E782 Mixed hyperlipidemia: Secondary | ICD-10-CM | POA: Diagnosis present

## 2024-06-16 DIAGNOSIS — I4892 Unspecified atrial flutter: Secondary | ICD-10-CM | POA: Insufficient documentation

## 2024-06-16 DIAGNOSIS — I10 Essential (primary) hypertension: Secondary | ICD-10-CM | POA: Insufficient documentation

## 2024-06-16 NOTE — Patient Instructions (Signed)
 Medication Instructions:  Your physician recommends that you continue on your current medications as directed. Please refer to the Current Medication list given to you today.   *If you need a refill on your cardiac medications before your next appointment, please call your pharmacy*  Lab Work: No labs ordered today  If you have labs (blood work) drawn today and your tests are completely normal, you will receive your results only by: MyChart Message (if you have MyChart) OR A paper copy in the mail If you have any lab test that is abnormal or we need to change your treatment, we will call you to review the results.  Testing/Procedures: No test ordered today   Follow-Up: At [facility], you and your health needs are our priority.  As part of our continuing mission to provide you with exceptional heart care, our providers are all part of one team.  This team includes your primary Cardiologist (physician) and Advanced Practice Providers or APPs (Physician Assistants and Nurse Practitioners) who all work together to provide you with the care you need, when you need it.  Your next appointment:   6 - 8 week(s)  Provider:   You will see one of the following Advanced Practice Providers on your designated Care Team:   Tylene Lunch, NP

## 2024-06-16 NOTE — Progress Notes (Unsigned)
 Cardiology Office Note   Date:  06/18/2024  ID:  Tina Wilkerson, Tina Wilkerson 1940/10/09, MRN [id number] PCP: Jyl Railing, MD  [facility] Providers Cardiologist:  None     History of Present Illness Tina Wilkerson is a 83 y.o. female with a past medical history of hypertension, hypothyroidism, seasonal allergies, new onset atrial flutter, community-acquired pneumonia, who presents today after recent hospitalization for new onset atrial flutter.  She was hospitalized at [facility] from 10/18 to 06/08/2024 for after presenting with symptoms of sore throat, runny nose with copious postnasal drip and developed productive cough with yellowish phlegm along with wheezing.  She attributed her breathing symptoms especially her wheezing to a seasonal problem when she had previously been prescribed Flonase and some antihypertensive medications which gave her some relief however she continued to have copious postnasal drip and wheezing with cough.  She denied any chest pain, fevers, chills.  She said that on Tuesday she had had diarrhea that resolved.  She denied any further episodes of abdominal pain and nausea, or vomiting.  She was treated for community-acquired pneumonia with multifocal pneumonia seen on CTA.  She was also treated for possible asthma exacerbation with oral prednisone, ceftriaxone, and doxycycline.  She had acute respiratory failure requiring oxygen therapy during her hospitalization which was titrated and weaned.  Cardiac monitoring revealed possible atrial fibrillation and flutter was confirmed by EKG on the day of discharge which was a new diagnosis.  Echocardiogram was completed which revealed an LVEF of 65 to 70%, no RWMA, G1 DD, and moderate mitral regurgitation.  There was also aortic valve sclerosis without evidence of aortic valve stenosis.  TSH was normal.  CHA2DS2-VASc score was at least 4 so she was started on carvedilol and apixaban 5 mg twice daily as she did not meet reduced  dosing criteria.  She also was noted to have elevated high-sensitivity troponin on arrival that was thought to be secondary to demand ischemia there was no further ischemic testing at that was needed.  She was to follow-up with her PCP and cardiology on an outpatient setting.  She was considered stable and was discharged home on 06/08/2024.  She presents today accompanied by her son.  She continues to have fatigue and some weakness since her discharge from the facility.  At she states that since being home she has had 1 episode where she had a nosebleed and called EMS.  Blood pressure was notably elevated at that time.  The nosebleed was taking care of by EMS and she did not require return evaluation in the emergency department.  She has been compliant with her current medication regimen.  She denies any other bleeding with her apixaban and no blood noted in her urine or stool.  Blood pressure has been better controlled at home with blood pressures in the 130s to 150s.  ROS: 10 point review of system has been reviewed and considered negative the exception was been listed in the HPI  Studies Reviewed EKG Interpretation Date/Time:  Wednesday June 16 2024 11:47:13 EDT Ventricular Rate:  58 PR Interval:  300 QRS Duration:  64 QT Interval:  428 QTC Calculation: 420 R Axis:   -22  Text Interpretation: Sinus bradycardia with sinus arrhythmia with 1st degree A-V block Anterior infarct (cited on or before 08-Jun-2024) When compared with ECG of 08-Jun-2024 13:31, Sinus rhythm has replaced Atrial flutter Confirmed by Gerard Frederick ([id number]) on 06/16/2024 11:55:20 AM    2d echo 06/06/2024 1. Left ventricular ejection fraction, by  estimation, is 65 to 70%. The  left ventricle has normal function. The left ventricle has no regional  wall motion abnormalities. Left ventricular diastolic parameters are  consistent with Grade I diastolic  dysfunction (impaired relaxation). The average left ventricular global   longitudinal strain is 26.8 %. The global longitudinal strain is normal.   2. Right ventricular systolic function is normal. The right ventricular  size is normal.   3. The mitral valve is myxomatous. Moderate mitral valve regurgitation.   4. The aortic valve is normal in structure. Aortic valve regurgitation is  not visualized. Aortic valve sclerosis is present, with no evidence of  aortic valve stenosis.   Risk Assessment/Calculations  CHA2DS2-VASc Score = 4   This indicates a 4.8% annual risk of stroke. The patient's score is based upon: CHF History: 0 HTN History: 1 Diabetes History: 0 Stroke History: 0 Vascular Disease History: 0 Age Score: 2 Gender Score: 1        Physical Exam VS:  BP (!) 146/78 (BP Location: Left Arm, Patient Position: Sitting, Cuff Size: Normal)   Pulse (!) 58   Ht 5' 3 (1.6 m)   Wt 136 lb (61.7 kg)   SpO2 96%   BMI 24.09 kg/m        Wt Readings from Last 3 Encounters:  06/16/24 136 lb (61.7 kg)  06/05/24 150 lb 5.7 oz (68.2 kg)  11/08/22 142 lb (64.4 kg)    GEN: Well nourished, well developed in no acute distress NECK: No JVD; No carotid bruits CARDIAC: RRR, bradycardic, no murmurs, rubs, gallops RESPIRATORY:  Clear to auscultation without rales, wheezing or rhonchi  ABDOMEN: Soft, non-tender, non-distended EXTREMITIES:  No edema; No deformity   ASSESSMENT AND PLAN New onset atrial fibrillation/atrial flutter in the setting of pneumonia.  EKG today reveals sinus bradycardia with sinus arrhythmia first-degree AV block with rate of 58 with an old anterior infarct.  She has been continued on carvedilol 3.25 mg twice daily and apixaban 5 mg twice daily for CHA2DS2-VASc score of at least 4 for stroke prophylaxis.  She has had 1 episode of nosebleed she has been advised to continue to use her saline spray and humidifier to keep some Afrin at home just in case it happens again.  If she continues with nosebleeds reduced dosing may have to be  considered.  Currently she has had no other episodes of bleeding and has-no blood noted in her stool or urine.  Hypertension with a blood pressure of 146/78.  Blood pressure slightly elevated.  She has been continued on losartan 100 mg daily and carvedilol 3.125 mg twice daily.  Previous medications PTA of amlodipine 5 mg daily, hydralazine 25 mg and HCTZ 12.5 mg have been placed on hold.  Previously HCTZ was paused due to hyponatremia.  Hydralazine was started due to elevated serum creatinine.  Patient is to continue to monitor pressures at home if continued elevation will consider restarting antihypertensive medications.  Hypothyroidism which she has been continued on levothyroxine 150 mcg daily before breakfast.  Ongoing management per PCP.  Hyperlipidemia with an LDL of 62.  Remains at goal.  She is continued on simvastatin 20 mg daily.  Elevated high-sensitivity troponins on recent hospitalization suspected to be demand ischemia from hospitalization and acute asthma exacerbation.  She continues to deny any chest discomfort.  Echocardiogram completed revealed an LVEF of 65-70%, G1 DD, moderate MR.  There were no regional wall motion abnormalities.  She completed 48 hours of heparin.  EKG today  reveals sinus bradycardia with sinus arrhythmia first-degree block with an old anterior infarct.  Once patient is feeling more like herself we will revisit the potential for stress testing due to elevated high-sensitivity troponins during hospitalization.  Recent hyponatremia during hospitalization with sodium 125 126 and 129.  HCTZ was discontinued.  TSH was normal and she is euvolemic on exam.  Clinically suspect it was chronic and possibly likely due to HCTZ.  PCP had just drawn blood work and sodium level was back up to 135 after being off of HCTZ.  Will continue to follow clinically.  Asthma with recent hospitalization for acute hypoxic respiratory failure multifocal pneumonia that initially required BiPAP  support.  Viral swabs were negative.  She initially required oxygen therapy that was weaned down.  She was on nebulized bronchodilators and IV antibiotics.  Recommend that she follow-up with pulmonary for PFTs.       Dispo: Patient to return to clinic to see MD/APP in 6-8 weeks or sooner if needed for further evaluation.  Signed, Dellar Traber, NP

## 2024-07-04 ENCOUNTER — Other Ambulatory Visit (HOSPITAL_COMMUNITY): Payer: Self-pay

## 2024-07-04 ENCOUNTER — Other Ambulatory Visit: Payer: Self-pay

## 2024-07-05 ENCOUNTER — Other Ambulatory Visit: Payer: Self-pay

## 2024-07-06 ENCOUNTER — Encounter: Payer: Self-pay | Admitting: Pharmacist

## 2024-07-06 ENCOUNTER — Other Ambulatory Visit: Payer: Self-pay

## 2024-07-07 ENCOUNTER — Other Ambulatory Visit (HOSPITAL_BASED_OUTPATIENT_CLINIC_OR_DEPARTMENT_OTHER): Payer: Self-pay

## 2024-07-07 ENCOUNTER — Other Ambulatory Visit: Payer: Self-pay

## 2024-07-09 ENCOUNTER — Encounter: Payer: Self-pay | Admitting: Student in an Organized Health Care Education/Training Program

## 2024-07-09 ENCOUNTER — Ambulatory Visit: Admitting: Student in an Organized Health Care Education/Training Program

## 2024-07-09 ENCOUNTER — Ambulatory Visit (INDEPENDENT_AMBULATORY_CARE_PROVIDER_SITE_OTHER): Admitting: Student in an Organized Health Care Education/Training Program

## 2024-07-09 VITALS — BP 138/76 | HR 48 | Temp 98.5°F | Ht 63.0 in | Wt 135.6 lb

## 2024-07-09 DIAGNOSIS — J309 Allergic rhinitis, unspecified: Secondary | ICD-10-CM | POA: Diagnosis not present

## 2024-07-09 DIAGNOSIS — J432 Centrilobular emphysema: Secondary | ICD-10-CM

## 2024-07-09 DIAGNOSIS — R911 Solitary pulmonary nodule: Secondary | ICD-10-CM

## 2024-07-09 MED ORDER — UMECLIDINIUM-VILANTEROL 62.5-25 MCG/ACT IN AEPB
1.0000 | INHALATION_SPRAY | Freq: Every day | RESPIRATORY_TRACT | 11 refills | Status: AC
Start: 2024-07-09 — End: 2025-07-09

## 2024-07-09 NOTE — Progress Notes (Signed)
 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 Symptoms include exertional dyspnea and occasional wheezing. She is at risk for COPD due to smoking history and emphysema. Will initiate empiric treatment with LAMA/LABA (Anoro) and reassess symptoms on follow up after PFT's.  - Ordered pulmonary function test to confirm COPD diagnosis. - Pulmonary Function Test - umeclidinium-vilanterol (ANORO ELLIPTA ) 62.5-25 MCG/ACT AEPB; Inhale 1 puff into the lungs daily.  Dispense: 30 each; Refill: 11  #Solitary pulmonary nodule (follow-up)  CT scan showed areas consistent with pneumonia. Follow-up imaging recommended to ensure resolution and rule out underlying malignancy  - CT CHEST WO CONTRAST; Future  #Allergic rhinitis with postnasal drip  Chronic postnasal drip with ineffective management using Zyrtec and nasal sprays. Symptoms include constant nasal drip on one side. - Continue Zyrtec and nasal sprays.   Return in about 3 months (around 10/09/2024).  Belva November, MD [facility]  I spent 60 minutes caring for this patient today, including preparing to see the patient, obtaining a medical history , reviewing a separately obtained history, performing a medically appropriate examination and/or evaluation, counseling and educating the patient/family/caregiver, ordering medications, tests, or procedures, documenting clinical information in the electronic health record, and independently interpreting results (not separately reported/billed) and communicating results to the patient/family/caregiver  End of visit medications:  Meds ordered this encounter  Medications   umeclidinium-vilanterol (ANORO ELLIPTA ) 62.5-25 MCG/ACT AEPB    Sig: Inhale 1 puff into the lungs daily.    Dispense:  30 each    Refill:  11     Current  Outpatient Medications:    [Paused] amLODipine (NORVASC) 5 MG tablet, Take 5 mg by mouth daily., Disp: , Rfl:    apixaban  (ELIQUIS ) 5 MG TABS tablet, Take 1 tablet (5 mg total) by mouth 2 (two) times daily., Disp: 60 tablet, Rfl: 1   Azelastine HCl 137 MCG/SPRAY SOLN, Place 1 spray into both nostrils 2 (two) times daily., Disp: , Rfl:    carvedilol  (COREG ) 3.125 MG tablet, Take 1 tablet (3.125 mg total) by mouth 2 (two) times daily., Disp: 60 tablet, Rfl: 1   cetirizine (ZYRTEC) 10 MG tablet, Take 10 mg by mouth daily., Disp: , Rfl:    Cholecalciferol 50 MCG (2000 UT) TABS, Take 2,000 Units by mouth every morning., Disp: , Rfl:    fluticasone  (FLONASE ) 50 MCG/ACT nasal spray, Place into the nose., Disp: , Rfl:    [Paused] hydrALAZINE  (APRESOLINE ) 25 MG tablet, Take 25 mg by mouth 2 (two) times daily., Disp: , Rfl:    [Paused] hydrochlorothiazide (HYDRODIURIL) 12.5 MG tablet, Take 12.5 mg by mouth daily., Disp: , Rfl:    levothyroxine  (SYNTHROID ) 150 MCG tablet, Take 150 mcg by mouth daily before breakfast., Disp: , Rfl:    losartan (COZAAR) 100 MG tablet, Take 100 mg by mouth daily., Disp: , Rfl:    simvastatin (ZOCOR) 20 MG tablet, Take 20 mg by mouth at bedtime., Disp: , Rfl:    umeclidinium-vilanterol (ANORO ELLIPTA ) 62.5-25 MCG/ACT AEPB, Inhale 1 puff into the lungs daily., Disp: 30 each, Rfl: 11   albuterol  (VENTOLIN  HFA) 108 (90 Base) MCG/ACT inhaler, Inhale 2 puffs into the lungs every 6 (six) hours as needed for wheezing or shortness of breath. (Patient not taking: Reported on 07/09/2024), Disp: 6.7 g, Rfl: 2   Subjective:   PATIENT ID: Tina Wilkerson GENDER: female  DOB: July 15, 1941, MRN: [id number]  Chief Complaint  Patient presents with   Shortness of Breath    DOE. No wheezing. Occasional cough. Has not been diagnosed with COPD, Emphysema or Asthma.    HPI  Discussed the use of AI scribe software for clinical note transcription with the patient, who gave verbal consent to  proceed.  History of Present Illness  Tina Wilkerson is an 83 year old female who presents for post-hospital discharge follow-up after an episode of acute hypoxic respiratory failure.  She was hospitalized in October for acute hypoxic respiratory failure. During her stay, a CT scan revealed multifocal pneumonia and emphysema. She was treated with antibiotics and steroids, which led to improvement. Her illness progressed rapidly from a sore throat to severe illness requiring hospitalization.  Since discharge on October 21, she has experienced significant fatigue and low energy throughout November, describing herself as 'very dragging'. She only began to feel like herself in the week leading up to this visit. She was hospitalized for four days and notes that recovery has been slow.  She denies any previous episodes of similar respiratory issues and states this was the worst illness she has experienced. She is concerned about the possibility of recurrence of her illness. She has a history of heavy smoking, having smoked a pack and a half per day for fifty years, starting at age 48 and quitting in her 48.  She was discharged with an albuterol  inhaler to be used as needed, which she has used minimally since returning home. She experiences occasional shortness of breath upon exertion but denies any current wheezing or chronic cough. She experiences a dry cough at night occasionally and has postnasal drip, for which she takes Zyrtec and uses two nasal sprays, though she feels they are not very effective recently.  Her social history includes a long career as an tourist information centre manager and [place]'s job with [employer]. She moved to [place]  in 2003. She has no known history of asthma or COPD prior to this recent episode, though she does have allergies. She has 50 pack years of smoking history   Ancillary information  including prior medications, full medical/surgical/family/social histories, and PFTs (when available) are listed below and have been reviewed.    Review of Systems  Constitutional:  Negative for chills, fever, malaise/fatigue and weight loss.  Respiratory:  Positive for shortness of breath. Negative for cough, hemoptysis, sputum production and wheezing.   Cardiovascular:  Negative for chest pain.     Objective:   Vitals:   07/09/24 0941  BP: 138/76  Pulse: (!) 48  Temp: 98.5 F (36.9 C)  SpO2: 96%  Weight: 135 lb 9.6 oz (61.5 kg)  Height: 5' 3 (1.6 m)   96% on RA BMI Readings from Last 3 Encounters:  07/09/24 24.02 kg/m  06/16/24 24.09 kg/m  06/05/24 26.63 kg/m   Wt Readings from Last 3 Encounters:  07/09/24 135 lb 9.6 oz (61.5 kg)  06/16/24 136 lb (61.7 kg)  06/05/24 150 lb 5.7 oz (68.2 kg)    .vitalsmbmi  Physical Exam Constitutional:      Appearance: Normal appearance. She is normal weight.  Cardiovascular:     Rate and Rhythm: Normal rate and regular rhythm.     Pulses: Normal pulses.     Heart sounds: Normal heart sounds.  Pulmonary:     Effort: Pulmonary effort is normal.     Breath sounds:  Normal breath sounds. No wheezing or rales.  Neurological:     General: No focal deficit present.     Mental Status: She is alert and oriented to person, place, and time. Mental status is at baseline.       Ancillary Information    Past Medical History:  Diagnosis Date   Hypertension    Thyroid disease      Family History  Problem Relation Age of Onset   Breast cancer Neg Hx      Past Surgical History:  Procedure Laterality Date   TONSILLECTOMY      Social History   Socioeconomic History   Marital status: Widowed    Spouse name: Not on file   Number of children: Not on file   Years of education: Not on file   Highest education level: Not on file  Occupational History   Not on file  Tobacco Use   Smoking status: Former    Types:  Cigarettes   Smokeless tobacco: Never   Tobacco comments:    Quit smoking at 83 years old.    Started smoking at 83 years old    Smoked 1.5 PPD at her heaviest  Vaping Use   Vaping status: Never Used  Substance and Sexual Activity   Alcohol use: Never   Drug use: Never   Sexual activity: Not on file  Other Topics Concern   Not on file  Social History Narrative   Not on file   Social Drivers of Health   Financial Resource Strain: Low Risk  (06/09/2024)   Received from [facility]   Overall Financial Resource Strain (CARDIA)    Difficulty of Paying Living Expenses: Not hard at all  Food Insecurity: No Food Insecurity (07/06/2024)   Received from [facility]   Hunger Vital Sign    Within the past 12 months, you worried that your food would run out before you got the money to buy more.: Never true    Within the past 12 months, the food you bought just didn't last and you didn't have money to get more.: Never true  Transportation Needs: Unmet Transportation Needs (07/06/2024)   Received from [facility] - Transportation    In the past 12 months, has lack of transportation kept you from medical appointments or from getting medications?: Yes    Lack of Transportation (Non-Medical): Yes  Physical Activity: Not on file  Stress: No Stress Concern Present (07/06/2024)   Received from [facility] - Occupational Stress Questionnaire    Feeling of Stress : Not at all  Social Connections: Unknown (07/06/2024)   Received from [facility]   Social Connection and Isolation Panel    In a typical week, how many times do you talk on the phone with family, friends, or neighbors?: More than three times a week    How often do you get together with friends or relatives?: More than three times a week    How often do you attend church or religious services?: 1  to 4 times per year    Do you belong to any clubs or organizations such as church groups, unions, fraternal or athletic groups, or school groups?: Yes    How often do you attend meetings of the clubs or organizations you belong to?: More than 4 times per year    Marital Status: Not on file  Intimate  Partner Violence: Not At Risk (06/05/2024)   Humiliation, Afraid, Rape, and Kick questionnaire    Fear of Current or Ex-Partner: No    Emotionally Abused: No    Physically Abused: No    Sexually Abused: No     Allergies  Allergen Reactions   Penicillins Hives   Pravastatin Other (See Comments)    Muscle pain   Sulfa Antibiotics Hives and Dermatitis   Alendronate Other (See Comments)    Dental problems and patient declines     CBC    Component Value Date/Time   WBC 21.8 (H) 06/08/2024 0516   RBC 3.07 (L) 06/08/2024 0516   HGB 10.0 (L) 06/08/2024 0516   HCT 29.3 (L) 06/08/2024 0516   PLT 387 06/08/2024 0516   MCV 95.4 06/08/2024 0516   MCH 32.6 06/08/2024 0516   MCHC 34.1 06/08/2024 0516   RDW 13.4 06/08/2024 0516   LYMPHSABS 0.4 (L) 06/05/2024 1329   MONOABS 1.4 (H) 06/05/2024 1329   EOSABS 0.1 06/05/2024 1329   BASOSABS 0.0 06/05/2024 1329    Pulmonary Functions Testing Results:     No data to display          Outpatient Medications Prior to Visit  Medication Sig Dispense Refill   amLODipine (NORVASC) 5 MG tablet Take 5 mg by mouth daily.     apixaban  (ELIQUIS ) 5 MG TABS tablet Take 1 tablet (5 mg total) by mouth 2 (two) times daily. 60 tablet 1   Azelastine HCl 137 MCG/SPRAY SOLN Place 1 spray into both nostrils 2 (two) times daily.     carvedilol  (COREG ) 3.125 MG tablet Take 1 tablet (3.125 mg total) by mouth 2 (two) times daily. 60 tablet 1   cetirizine (ZYRTEC) 10 MG tablet Take 10 mg by mouth daily.     Cholecalciferol 50 MCG (2000 UT) TABS Take 2,000 Units by mouth every morning.     fluticasone  (FLONASE ) 50 MCG/ACT nasal spray Place into the nose.      hydrALAZINE  (APRESOLINE ) 25 MG tablet Take 25 mg by mouth 2 (two) times daily.     hydrochlorothiazide (HYDRODIURIL) 12.5 MG tablet Take 12.5 mg by mouth daily.     levothyroxine  (SYNTHROID ) 150 MCG tablet Take 150 mcg by mouth daily before breakfast.     losartan (COZAAR) 100 MG tablet Take 100 mg by mouth daily.     simvastatin (ZOCOR) 20 MG tablet Take 20 mg by mouth at bedtime.     albuterol  (VENTOLIN  HFA) 108 (90 Base) MCG/ACT inhaler Inhale 2 puffs into the lungs every 6 (six) hours as needed for wheezing or shortness of breath. (Patient not taking: Reported on 07/09/2024) 6.7 g 2   No facility-administered medications prior to visit.

## 2024-07-09 NOTE — Patient Instructions (Signed)
  VISIT SUMMARY: You had a follow-up appointment after being hospitalized for acute hypoxic respiratory failure. During your hospital stay, you were diagnosed with multifocal pneumonia and emphysema, and you were treated with antibiotics and steroids. Since your discharge, you have experienced significant fatigue but have started to feel better recently. You have a history of heavy smoking, which has contributed to your current lung issues. You are concerned about the possibility of recurrence of your illness.  YOUR PLAN: -CENTRILOBULAR EMPHYSEMA WITH SUSPECTED CHRONIC OBSTRUCTIVE PULMONARY DISEASE (COPD): Emphysema is a condition where the air sacs in the lungs are damaged, leading to breathing difficulties. Given your history of heavy smoking and the findings on your CT scan, you are at risk for COPD. We have ordered a pulmonary function test to confirm the diagnosis. You have been prescribed a long-acting inhaler to use once daily, and you were given instructions on how to use it.  -SOLITARY PULMONARY NODULE (FOLLOW-UP): A solitary pulmonary nodule is a small, round growth in the lung that was found during your CT scan. It is important to monitor this nodule to ensure it does not grow or change. We have ordered a repeat CT scan in three months to check on it.  -ALLERGIC RHINITIS WITH POSTNASAL DRIP: Allergic rhinitis is an allergic reaction that causes sneezing, congestion, and a runny nose. You have been experiencing chronic postnasal drip, which is when mucus drips down the back of your throat. You should continue taking Zyrtec and using your nasal sprays as previously directed.  INSTRUCTIONS: Please schedule a pulmonary function test as soon as possible. Additionally, make sure to have a repeat CT scan in three months to monitor the solitary pulmonary nodule. Continue using your long-acting inhaler daily and follow the instructions provided. If you have any questions or concerns, do not hesitate to  contact our office.      Contains text generated by [employer].

## 2024-07-30 ENCOUNTER — Other Ambulatory Visit: Payer: Self-pay

## 2024-08-01 ENCOUNTER — Other Ambulatory Visit: Payer: Self-pay

## 2024-08-02 ENCOUNTER — Telehealth: Payer: Self-pay | Admitting: Cardiology

## 2024-08-02 MED ORDER — APIXABAN 5 MG PO TABS: 5.0000 mg | ORAL_TABLET | Freq: Two times a day (BID) | ORAL | 1 refills | Status: DC

## 2024-08-02 MED ORDER — CARVEDILOL 3.125 MG PO TABS: 3.1250 mg | ORAL_TABLET | Freq: Two times a day (BID) | ORAL | 1 refills | Status: AC

## 2024-08-02 NOTE — Telephone Encounter (Signed)
*  STAT* If patient is at the pharmacy, call can be transferred to refill team.     1. Which medications need to be refilled? (please list name of each medication and dose if known) apixaban  (ELIQUIS ) 5 MG TABS tablet   carvedilol  (COREG ) 3.125 MG tablet    2. Would you like to learn more about the convenience, safety, & potential cost savings by using the [facility]? NO   3. Are you open to using the [facility] (Type [facility]. )     4. Which pharmacy/location (including street and city if local pharmacy) is medication to be sent to? [id number] [address], [place] [phone]      5. Do they need a 30 day or 90 day supply? Refill until apt 08/09/24

## 2024-08-02 NOTE — Telephone Encounter (Signed)
 Refill has already been sent today.

## 2024-08-09 ENCOUNTER — Ambulatory Visit: Attending: Cardiology | Admitting: Cardiology

## 2024-08-09 ENCOUNTER — Encounter: Payer: Self-pay | Admitting: Cardiology

## 2024-08-09 VITALS — BP 130/76 | HR 60 | Ht 63.0 in | Wt 134.0 lb

## 2024-08-09 DIAGNOSIS — E039 Hypothyroidism, unspecified: Secondary | ICD-10-CM | POA: Diagnosis not present

## 2024-08-09 DIAGNOSIS — E782 Mixed hyperlipidemia: Secondary | ICD-10-CM | POA: Insufficient documentation

## 2024-08-09 DIAGNOSIS — I480 Paroxysmal atrial fibrillation: Secondary | ICD-10-CM | POA: Diagnosis not present

## 2024-08-09 DIAGNOSIS — J45909 Unspecified asthma, uncomplicated: Secondary | ICD-10-CM | POA: Insufficient documentation

## 2024-08-09 DIAGNOSIS — I483 Typical atrial flutter: Secondary | ICD-10-CM | POA: Diagnosis not present

## 2024-08-09 DIAGNOSIS — I10 Essential (primary) hypertension: Secondary | ICD-10-CM | POA: Insufficient documentation

## 2024-08-09 NOTE — Patient Instructions (Signed)
 Medication Instructions:  Your physician recommends that you continue on your current medications as directed. Please refer to the Current Medication list given to you today.   *If you need a refill on your cardiac medications before your next appointment, please call your pharmacy*  Lab Work: No labs ordered today  If you have labs (blood work) drawn today and your tests are completely normal, you will receive your results only by: MyChart Message (if you have MyChart) OR A paper copy in the mail If you have any lab test that is abnormal or we need to change your treatment, we will call you to review the results.  Testing/Procedures: No test ordered today   Follow-Up: At [facility], you and your health needs are our priority.  As part of our continuing mission to provide you with exceptional heart care, our providers are all part of one team.  This team includes your primary Cardiologist (physician) and Advanced Practice Providers or APPs (Physician Assistants and Nurse Practitioners) who all work together to provide you with the care you need, when you need it.  Your next appointment:   3 month(s)  Provider:   Ronald Cockayne, NP

## 2024-08-09 NOTE — Progress Notes (Signed)
 " Cardiology Office Note   Date:  08/09/2024  ID:  Tina Wilkerson, Tina Wilkerson Aug 19, 1941, MRN [id number] PCP: Jyl Railing, MD  [facility] Providers Cardiologist:  None     History of Present Illness Tina Wilkerson is a 83 y.o. female with past medical history of hypertension, hypothyroidism, sick sinus syndrome, new onset atrial flutter, community-acquired pneumonia, who presents today for follow-up.   She was hospitalized at [facility] from 10/18 to 06/08/2024 for after presenting with symptoms of sore throat, runny nose with copious postnasal drip and developed productive cough with yellowish phlegm along with wheezing.  She attributed her breathing symptoms especially her wheezing to a seasonal problem when she had previously been prescribed Flonase  and some antihypertensive medications which gave her some relief however she continued to have copious postnasal drip and wheezing with cough.  She denied any chest pain, fevers, chills.  She said that on Tuesday she had had diarrhea that resolved.  She denied any further episodes of abdominal pain and nausea, or vomiting.  She was treated for community-acquired pneumonia with multifocal pneumonia seen on CTA.  She was also treated for possible asthma exacerbation with oral prednisone , ceftriaxone , and doxycycline .  She had acute respiratory failure requiring oxygen therapy during her hospitalization which was titrated and weaned.  Cardiac monitoring revealed possible atrial fibrillation and flutter was confirmed by EKG on the day of discharge which was a new diagnosis.  Echocardiogram was completed which revealed an LVEF of 65 to 70%, no RWMA, G1 DD, and moderate mitral regurgitation.  There was also aortic valve sclerosis without evidence of aortic valve stenosis.  TSH was normal.  CHA2DS2-VASc score was at least 4 so she was started on carvedilol  and apixaban  5 mg twice daily as she did not meet reduced dosing criteria.  She also was noted to have  elevated high-sensitivity troponin on arrival that was thought to be secondary to demand ischemia there was no further ischemic testing at that was needed.  She was to follow-up with her PCP and cardiology on an outpatient setting.  She was considered stable and was discharged home on 06/08/2024.   She was last seen in clinic 06/16/2024 accompanied by her son.  She continued to have complaints of fatigue and some weakness since her discharge in a facility.  She states since being home she had had 1 episode of a nosebleed and called EMS.  Blood pressure was notably elevated at that time.  She was started on hydralazine  25 mg in addition to her regular blood pressure regimen.  No further testing was ordered at that time.   She returns to clinic today stating that overall she has been doing much better since being home from the hospital.  She has had 1 additional nosebleed since she was last seen but did not require the assistance of EMS or a visit to the emergency department.  She was able to use her nasal spray to contain her bleeding.  Since that time she has had no reoccurrence.  States that she has been compliant with her current medication regimen without any undue side effects.  States that she has not missed any of her apixaban  and denies any blood noted in her stool or urine.  She also denies any recent hospitalizations or visits to the emergency department.  ROS: 10 point review of system has been reviewed and considered negative the exception was been listed in the HPI  Studies Reviewed EKG Interpretation Date/Time:  Monday August 09 2024 13:49:38 EST Ventricular Rate:  60 PR Interval:  376 QRS Duration:  64 QT Interval:  414 QTC Calculation: 414 R Axis:   18  Text Interpretation: Sinus rhythm with 1st degree A-V block Possible Left atrial enlargement Septal infarct (cited on or before 08-Jun-2024) When compared with ECG of 16-Jun-2024 11:47, No significant change was found Confirmed by  Gerard Frederick ([id number]) on 08/09/2024 1:51:56 PM    2d echo 06/06/2024 1. Left ventricular ejection fraction, by estimation, is 65 to 70%. The  left ventricle has normal function. The left ventricle has no regional  wall motion abnormalities. Left ventricular diastolic parameters are  consistent with Grade I diastolic  dysfunction (impaired relaxation). The average left ventricular global  longitudinal strain is 26.8 %. The global longitudinal strain is normal.   2. Right ventricular systolic function is normal. The right ventricular  size is normal.   3. The mitral valve is myxomatous. Moderate mitral valve regurgitation.   4. The aortic valve is normal in structure. Aortic valve regurgitation is  not visualized. Aortic valve sclerosis is present, with no evidence of  aortic valve stenosis.   Risk Assessment/Calculations  CHA2DS2-VASc Score = 4   This indicates a 4.8% annual risk of stroke. The patient's score is based upon: CHF History: 0 HTN History: 1 Diabetes History: 0 Stroke History: 0 Vascular Disease History: 0 Age Score: 2 Gender Score: 1            Physical Exam VS:  BP 130/76 (BP Location: Left Arm, Patient Position: Sitting, Cuff Size: Normal)   Pulse 60   Ht 5' 3 (1.6 m)   Wt 134 lb (60.8 kg)   SpO2 96%   BMI 23.74 kg/m        Wt Readings from Last 3 Encounters:  08/09/24 134 lb (60.8 kg)  07/09/24 135 lb 9.6 oz (61.5 kg)  06/16/24 136 lb (61.7 kg)    GEN: Well nourished, well developed in no acute distress NECK: No JVD; No carotid bruits CARDIAC: RRR, no murmurs, rubs, gallops RESPIRATORY:  Clear to auscultation without rales, wheezing or rhonchi  ABDOMEN: Soft, non-tender, non-distended EXTREMITIES:  No edema; No deformity   ASSESSMENT AND PLAN Paroxysmal atrial fibrillation/atrial flutter she is maintaining sinus rhythm with first-degree AV block with a rate of 69.  No acute ischemic changes noted on EKG today.  She is continued on carvedilol   3.125 mg twice daily and apixaban  5 mg twice daily for CHA2DS2-VASc of at least 4 for stroke prophylaxis.  She does not meet reduced dosing criteria at this time.  She states that she has had 1 additional nosebleed but no further issues with bleeding or blood noted in her urine or stool.  She has been referred to Pharm.D. for possibility of obtaining assistance of being able to afford her apixaban  as she lives on her Social Security income.  Hypertension with a blood pressure today of 140/88 and repeat of 130/76.  Blood pressures continued to remain stable.  She is continued on carvedilol  3.25 mg twice daily, hydralazine  25 mg twice daily, HCTZ 12.5 mg daily, and losartan 100 mg daily.  She has been encouraged to continue to monitor her blood pressure 1 to 2 hours postmedication administration as well.  Hypothyroidism where she has been continued on levothyroxine  150 mcg daily with ongoing management per PCP.  Hyperlipidemia with last LDL 62 that remains at goal.  She is continued on simvastatin 20 mg daily.  Asthma/emphysema suspicious for COPD completed after  recent hospitalization for acute hypoxic respiratory failure with multifocal pneumonia she initially required BiPAP support.  Viral swabs were negative and she has followed up with pulmonary.  She was recently inhaled by pulmonary which she had to start due to concerns with cost and she has been scheduled for PFTs.  Ongoing management per pulmonary.       Dispo: Patient return to clinic to see MD/APP in 3 months or sooner if needed for further evaluation.  Signed, Andrian Sabala, NP   "

## 2024-08-10 ENCOUNTER — Other Ambulatory Visit (HOSPITAL_COMMUNITY): Payer: Self-pay

## 2024-08-10 ENCOUNTER — Telehealth: Payer: Self-pay | Admitting: Pharmacy Technician

## 2024-08-10 NOTE — Telephone Encounter (Signed)
 PAP: Patient assistance application for Eliquis through [employer] ([employer]) has been mailed to pt's home address on file. Provider portion of application will be faxed to provider's office.

## 2024-08-23 ENCOUNTER — Other Ambulatory Visit (HOSPITAL_COMMUNITY): Payer: Self-pay

## 2024-08-23 NOTE — Telephone Encounter (Signed)
 Working on getting switched to xarelto to see about assistance for that

## 2024-08-24 NOTE — Telephone Encounter (Signed)
 Form completed and faxed to [phone]

## 2024-08-24 NOTE — Telephone Encounter (Signed)
 Did you print the form under the media tab and start filling in the information?

## 2024-08-24 NOTE — Telephone Encounter (Signed)
 Changed from Eliquis  to xarelto- provider form faxed over

## 2024-08-26 ENCOUNTER — Ambulatory Visit: Admission: EM | Admit: 2024-08-26 | Discharge: 2024-08-26 | Disposition: A | Discharge status: Home / Self Care

## 2024-08-26 DIAGNOSIS — R040 Epistaxis: Secondary | ICD-10-CM

## 2024-08-26 NOTE — ED Provider Notes (Signed)
 " [facility]    CSN: [id number] Arrival date & time: 08/26/24  1636      History   Chief Complaint No chief complaint on file.   HPI Tina Wilkerson is a 84 y.o. female.   Patient presents for persisting left nostril nasal bleeding present since 2:30 PM, persisted for approximately an hour before resolving for 30 minutes then restarting.  Endorses started while sitting without any straining.  Taking Eliquis .  Has attempted manual pressure.  Past Medical History:  Diagnosis Date   Hypertension    Thyroid disease     Patient Active Problem List   Diagnosis Date Noted   Atrial flutter (HCC) 06/08/2024   Severe sepsis (HCC) 06/06/2024   Multifocal pneumonia 06/05/2024   Troponin level elevated 06/05/2024   Acute respiratory failure with hypoxia (HCC) 06/05/2024   CAP (community acquired pneumonia) 06/05/2024   Essential hypertension 09/28/2014    Past Surgical History:  Procedure Laterality Date   TONSILLECTOMY      OB History   No obstetric history on file.      Home Medications    Prior to Admission medications  Medication Sig Start Date End Date Taking? Authorizing Provider  albuterol  (VENTOLIN  HFA) 108 (90 Base) MCG/ACT inhaler Inhale 2 puffs into the lungs every 6 (six) hours as needed for wheezing or shortness of breath. Patient not taking: Reported on 08/09/2024 06/08/24   Kandis Devaughn Sayres, MD  [Paused] amLODipine (NORVASC) 5 MG tablet Take 5 mg by mouth daily. Patient not taking: Reported on 08/09/2024 Wait to take this until your doctor or other care provider tells you to start again. 01/27/22 05/03/25  [provider]  apixaban  (ELIQUIS ) 5 MG TABS tablet Take 1 tablet (5 mg total) by mouth 2 (two) times daily. 08/02/24   Darliss Rogue, MD  Azelastine HCl 137 MCG/SPRAY SOLN Place 1 spray into both nostrils 2 (two) times daily.    [provider]  carvedilol  (COREG ) 3.125 MG tablet Take 1 tablet (3.125 mg total) by mouth 2  (two) times daily. 08/02/24 08/02/25  Darliss Rogue, MD  cetirizine (ZYRTEC) 10 MG tablet Take 10 mg by mouth daily.    [provider]  Cholecalciferol 50 MCG (2000 UT) TABS Take 2,000 Units by mouth every morning. 12/24/10   [provider]  fluticasone  (FLONASE ) 50 MCG/ACT nasal spray Place into the nose. 06/25/21 08/09/24  [provider]  [Paused] hydrALAZINE  (APRESOLINE ) 25 MG tablet Take 25 mg by mouth 2 (two) times daily. Patient not taking: Reported on 08/09/2024 Wait to take this until your doctor or other care provider tells you to start again.    [provider]  [Paused] hydrochlorothiazide (HYDRODIURIL) 12.5 MG tablet Take 12.5 mg by mouth daily. Patient not taking: Reported on 08/09/2024 Wait to take this until your doctor or other care provider tells you to start again. 02/19/24   [provider]  levothyroxine  (SYNTHROID ) 150 MCG tablet Take 150 mcg by mouth daily before breakfast.    [provider]  losartan (COZAAR) 100 MG tablet Take 100 mg by mouth daily. 01/26/24 01/25/25  [provider]  simvastatin (ZOCOR) 20 MG tablet Take 20 mg by mouth at bedtime.    [provider]  umeclidinium-vilanterol (ANORO ELLIPTA ) 62.5-25 MCG/ACT AEPB Inhale 1 puff into the lungs daily. Patient not taking: Reported on 08/09/2024 07/09/24 07/09/25  Isadora Hose, MD    Family History Family History  Problem Relation Age of Onset   Breast cancer  Neg Hx     Social History Social History[1]   Allergies   Penicillins, Pravastatin, Sulfa antibiotics, and Alendronate   Review of Systems Review of Systems   Physical Exam Triage Vital Signs ED Triage Vitals  Encounter Vitals Group     BP      Girls Systolic BP Percentile      Girls Diastolic BP Percentile      Boys Systolic BP Percentile      Boys Diastolic BP Percentile      Pulse      Resp      Temp      Temp src      SpO2      Weight      Height       Head Circumference      Peak Flow      Pain Score      Pain Loc      Pain Education      Exclude from Growth Chart    No data found.  Updated Vital Signs There were no vitals taken for this visit.  Visual Acuity Right Eye Distance:   Left Eye Distance:   Bilateral Distance:    Right Eye Near:   Left Eye Near:    Bilateral Near:     Physical Exam Constitutional:      Appearance: Normal appearance.  HENT:     Nose:     Comments: Profuse nasal bleeding to the left side Eyes:     Extraocular Movements: Extraocular movements intact.  Pulmonary:     Effort: Pulmonary effort is normal.  Neurological:     Mental Status: She is alert and oriented to person, place, and time.      UC Treatments / Results  Labs (all labs ordered are listed, but only abnormal results are displayed) Labs Reviewed - No data to display  EKG   Radiology No results found.  Procedures Procedures (including critical care time)  Medications Ordered in UC Medications - No data to display  Initial Impression / Assessment and Plan / UC Course  I have reviewed the triage vital signs and the nursing notes.  Pertinent labs & imaging results that were available during my care of the patient were reviewed by me and considered in my medical decision making (see chart for details).  Left epistaxis  Bleeding on initial triage, able to resolve with use of Afrin nasal spray and persistent manual hold, given use for home and discussed administration, discussed when to follow-up if bleeding does not resolve, advised to notify primary doctor of nasal bleeding, discussed management of a dry near to prevent reoccurrence Final Clinical Impressions(s) / UC Diagnoses   Final diagnoses:  None   Discharge Instructions   None    ED Prescriptions   None    PDMP not reviewed this encounter.     [1]  Social History Tobacco Use   Smoking status: Former    Types: Cigarettes   Smokeless tobacco:  Never   Tobacco comments:    Quit smoking at 84 years old.    Started smoking at 84 years old    Smoked 1.5 PPD at her heaviest  Vaping Use   Vaping status: Never Used  Substance Use Topics   Alcohol use: Never   Drug use: Never     Teresa Shelba SAUNDERS, NP 08/26/24 1948  "

## 2024-08-26 NOTE — Discharge Instructions (Signed)
 Today you are evaluated for the nosebleed most likely due to dryness of the nose  We have used Afrin nasal spray and manual pressure to stop the bleeding  At home if bleeding restarts you may give yourself a dose of the Afrin nasal spray and hold manual pressure for at least 15 at that time may reevaluate and then may repeat twice  If bleeding has not stopped after the third dose of nasal spray please seek out reevaluation  To help with dryness of the nose May use saline nasal spray, humidifier at nighttime and coating the nose using a Q-tip and Vaseline  Please notify your doctor of nasal bleeding due to use of Eliquis 

## 2024-08-26 NOTE — ED Notes (Signed)
 Patient triage by  Teresa Shelba SAUNDERS, NP

## 2024-08-27 ENCOUNTER — Telehealth: Payer: Self-pay | Admitting: Cardiology

## 2024-08-27 NOTE — Telephone Encounter (Signed)
 Patient states that she has a nose bleed on yesterday and it cause her to have to go to the ER. Wanted to called the dr to make him aware. Please advise

## 2024-08-27 NOTE — Telephone Encounter (Signed)
Left message for patient to call back for more information.  

## 2024-08-31 NOTE — Telephone Encounter (Signed)
 Wating on patient form

## 2024-08-31 NOTE — Telephone Encounter (Signed)
 Did we ever hear back from her with any other concerns or details?

## 2024-09-02 ENCOUNTER — Other Ambulatory Visit: Payer: Self-pay | Admitting: Cardiology

## 2024-09-15 ENCOUNTER — Other Ambulatory Visit (HOSPITAL_COMMUNITY): Payer: Self-pay

## 2024-09-15 NOTE — Telephone Encounter (Signed)
 Patient stayed on eliquis 

## 2024-09-24 ENCOUNTER — Other Ambulatory Visit (HOSPITAL_COMMUNITY): Payer: Self-pay

## 2024-10-07 ENCOUNTER — Ambulatory Visit

## 2024-10-21 ENCOUNTER — Ambulatory Visit: Admitting: Student in an Organized Health Care Education/Training Program

## 2024-10-21 ENCOUNTER — Encounter

## 2024-10-25 ENCOUNTER — Ambulatory Visit: Admitting: Student in an Organized Health Care Education/Training Program

## 2024-10-25 ENCOUNTER — Encounter

## 2024-11-12 ENCOUNTER — Ambulatory Visit: Admitting: Cardiology
